# Patient Record
Sex: Male | Born: 1955 | Hispanic: No | Marital: Married | Smoking: Never smoker
Health system: Southern US, Community
[De-identification: ages and names within clinical notes are randomized; demographics above are authoritative.]

## PROBLEM LIST (undated history)

## (undated) DIAGNOSIS — F419 Anxiety disorder, unspecified: Secondary | ICD-10-CM

## (undated) DIAGNOSIS — I4510 Unspecified right bundle-branch block: Secondary | ICD-10-CM

## (undated) DIAGNOSIS — R55 Syncope and collapse: Secondary | ICD-10-CM

## (undated) DIAGNOSIS — Z95820 Peripheral vascular angioplasty status with implants and grafts: Secondary | ICD-10-CM

## (undated) DIAGNOSIS — E785 Hyperlipidemia, unspecified: Secondary | ICD-10-CM

## (undated) DIAGNOSIS — E786 Lipoprotein deficiency: Secondary | ICD-10-CM

## (undated) DIAGNOSIS — E663 Overweight: Secondary | ICD-10-CM

## (undated) DIAGNOSIS — E7800 Pure hypercholesterolemia, unspecified: Secondary | ICD-10-CM

## (undated) DIAGNOSIS — I2510 Atherosclerotic heart disease of native coronary artery without angina pectoris: Secondary | ICD-10-CM

## (undated) DIAGNOSIS — E119 Type 2 diabetes mellitus without complications: Secondary | ICD-10-CM

## (undated) DIAGNOSIS — R42 Dizziness and giddiness: Secondary | ICD-10-CM

## (undated) DIAGNOSIS — I10 Essential (primary) hypertension: Secondary | ICD-10-CM

## (undated) DIAGNOSIS — R0789 Other chest pain: Secondary | ICD-10-CM

## (undated) DIAGNOSIS — M549 Dorsalgia, unspecified: Secondary | ICD-10-CM

## (undated) HISTORY — PX: KNEE ARTHROSCOPY: SHX127

## (undated) HISTORY — PX: SURGERY SCROTAL / TESTICULAR: SUR1316

## (undated) HISTORY — DX: Dorsalgia, unspecified: M54.9

## (undated) HISTORY — DX: Lipoprotein deficiency: E78.6

## (undated) HISTORY — PX: SHOULDER SURGERY: SHX246

## (undated) HISTORY — DX: Hyperlipidemia, unspecified: E78.5

## (undated) HISTORY — DX: Dizziness and giddiness: R42

## (undated) HISTORY — DX: Syncope and collapse: R55

## (undated) HISTORY — DX: Pure hypercholesterolemia, unspecified: E78.00

## (undated) HISTORY — DX: Overweight: E66.3

## (undated) HISTORY — DX: Unspecified right bundle-branch block: I45.10

## (undated) HISTORY — DX: Type 2 diabetes mellitus without complications: E11.9

## (undated) HISTORY — DX: Anxiety disorder, unspecified: F41.9

## (undated) HISTORY — DX: Essential (primary) hypertension: I10

---
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
## 1998-07-29 ENCOUNTER — Emergency Department (HOSPITAL_COMMUNITY): Admission: EM | Admit: 1998-07-29 | Discharge: 1998-07-30 | Payer: Self-pay | Admitting: Emergency Medicine

## 1998-07-29 ENCOUNTER — Encounter: Payer: Self-pay | Admitting: Cardiovascular Disease

## 1998-07-30 ENCOUNTER — Encounter: Payer: Self-pay | Admitting: Cardiovascular Disease

## 2006-02-18 ENCOUNTER — Ambulatory Visit (HOSPITAL_BASED_OUTPATIENT_CLINIC_OR_DEPARTMENT_OTHER): Admission: RE | Admit: 2006-02-18 | Discharge: 2006-02-18 | Payer: Self-pay | Admitting: Orthopedic Surgery

## 2008-12-27 ENCOUNTER — Ambulatory Visit (HOSPITAL_BASED_OUTPATIENT_CLINIC_OR_DEPARTMENT_OTHER): Admission: RE | Admit: 2008-12-27 | Discharge: 2008-12-27 | Payer: Self-pay | Admitting: Specialist

## 2009-06-12 ENCOUNTER — Encounter: Admission: RE | Admit: 2009-06-12 | Discharge: 2009-06-12 | Payer: Self-pay | Admitting: Family Medicine

## 2010-09-20 LAB — POCT I-STAT 4, (NA,K, GLUC, HGB,HCT)
Glucose, Bld: 111 mg/dL — ABNORMAL HIGH (ref 70–99)
HCT: 43 % (ref 39.0–52.0)
Hemoglobin: 14.6 g/dL (ref 13.0–17.0)
Potassium: 3.8 mEq/L (ref 3.5–5.1)
Sodium: 141 mEq/L (ref 135–145)
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
## 2010-10-30 NOTE — Op Note (Signed)
Jason Suarez, Jason Suarez NO.:  [id number]   MEDICAL RECORD NO.:  [id number]          PATIENT TYPE:  AMB   LOCATION:  [facility]                         FACILITY:  [facility]   PHYSICIAN:  Marlowe Kays, M.D.  DATE OF BIRTH:  04/06/1956   DATE OF PROCEDURE:  02/18/2006  DATE OF DISCHARGE:                                 OPERATIVE REPORT   PREOPERATIVE DIAGNOSIS:  Suspected torn lateral meniscus, right knee.   POSTOPERATIVE DIAGNOSES:  1. Torn medial and lateral menisci.  2. Full-thickness cartilaginous defect, lateral tibial plateau   OPERATION:  1. Right knee arthroscopy with partial medial meniscectomy.  2. Debridement of the cartilaginous defect of the articular cartilaginous      defect, lateral tibial plateau.   SURGEON:  Marlowe Kays, M.D.   ASSISTANT:  Nurse.   ANESTHESIA:  General.   INDICATIONS FOR PROCEDURE:  He was having no problems with his knee until  early July after working out on a treadmill. He has had persistent pain  mainly over the lateral joint.  An MRI of 08/08 has demonstrated some  irregularity of the lateral tibial plateau,  what was felt the patient be a  subtle tear of the lateral meniscus.  See the operative  description below  for the additional details of the pathology.  Because of failure to improve  with rest and bracing and time, he is here today for the above-mentioned  surgery.   PROCEDURE:  Satisfactory general anesthesia, pneumatic tourniquet right leg  was Esmarched out non-sterilely and inflated to 250 mmHg.  The thigh  stabilizer applied.  The left leg was wrapped with an Ace wrap and a  posterior knee support, with superior medial saline inflow.  I first entered  the medial portal, lateral compartment and  the  knee joint was evaluated.  He had some fraying of the anterior third of the lateral meniscus.  The  major pathology was a full-thickness defect with a flap of articular  cartilage in the mid-medial portion of the  lateral tibial plateau.  There  was also some associated fraying of the ACL and posterior lateral meniscus.  All of these areas were debrided out with the 3.5 shaver with final pictures  being taken.  Looking at the lateral gutter and sub-patellar area, no  operable problems were noted.  There was some very minimal wear of the  patella.  I then reversed portals.  The medial meniscus was completely  normal except for the anterior third.  I first noted a large amount of  synovitis there which I felt might be a mechanical problem.  I began  resecting it, finding that it was attached to an obviously-damaged anterior  horn of the medial meniscus which I first shaved with a 3.5 shaver and then  trimmed down with a small arthroscopic scissors.  Final pictures were taken.  The joint was then irrigated until clear and all fluid possible removed. The  two anterior  portals were closed with #4-0 nylon.  I injected 20 mL of half  percent Marcaine with adrenalin and 4 mg of morphine through the  inflow  apparatus which was removed and this portal closed with #4-0 nylon as well.  Betadine and adaptic dressing were applied.  The tourniquet was released.   He tolerated the procedure well was taken to the recovery room in  satisfactory condition,  with no known complications.           ______________________________  Marlowe Kays, M.D.     JA/MEDQ  D:  02/18/2006  T:  02/18/2006  Job:  [id number]

## 2011-04-27 DIAGNOSIS — E538 Deficiency of other specified B group vitamins: Secondary | ICD-10-CM | POA: Insufficient documentation

## 2011-11-07 ENCOUNTER — Encounter: Payer: Self-pay | Admitting: *Deleted

## 2012-03-03 ENCOUNTER — Encounter: Payer: Self-pay | Admitting: Cardiovascular Disease

## 2012-03-03 ENCOUNTER — Ambulatory Visit (INDEPENDENT_AMBULATORY_CARE_PROVIDER_SITE_OTHER): Payer: BC Managed Care – PPO | Admitting: Cardiovascular Disease

## 2012-03-03 ENCOUNTER — Other Ambulatory Visit: Payer: Self-pay | Admitting: *Deleted

## 2012-03-03 VITALS — BP 135/87 | HR 56 | Ht 65.0 in | Wt 156.1 lb

## 2012-03-03 DIAGNOSIS — R0789 Other chest pain: Secondary | ICD-10-CM

## 2012-03-03 DIAGNOSIS — I4510 Unspecified right bundle-branch block: Secondary | ICD-10-CM

## 2012-03-03 HISTORY — DX: Other chest pain: R07.89

## 2012-03-03 HISTORY — DX: Unspecified right bundle-branch block: I45.10

## 2012-03-03 NOTE — Assessment & Plan Note (Signed)
Is ready presents today with recurrent episodes of chest pressure. These episodes have been present for the past several months and typically last for a few minutes. They're not necessarily associated with any specific activities.  He has a) to block at baseline.  We will refer him for a stress echocardiogram for further evaluation. I'll see him on an as-needed basis following a stress echo.

## 2012-03-03 NOTE — Progress Notes (Signed)
    Ashkan Betti Cruz Date of Birth  Nov 26, 1955       [facility] Office [address]  [address] [place], [place]  [address]   [place], [place]  [address] [phone]     [phone]   Fax  [phone]    Fax [phone]  Problem List: 1. Chest pressure  History of Present Illness:  Mr. Langlois presents today with episodes of recurrent chest pressure/ heaviness. I've seen him in the past for episodes of chest pressure. These episodes started several months ago. They typically last a few minutes at a time. They're not associated with exercise, eating, drinking, change of position or taking a deep breath.  We performed a stress myoivew several years ago which was normal.    Current Outpatient Prescriptions on File Prior to Visit  Medication Sig Dispense Refill  . ANDROGEL PUMP 20.25 MG/ACT (1.62%) GEL daily.       Marland Kitchen aspirin 81 MG tablet Take 81 mg by mouth daily.      . fish oil-omega-3 fatty acids 1000 MG capsule Take 2 g by mouth daily.      Marland Kitchen losartan (COZAAR) 50 MG tablet Take 100 mg by mouth daily.       . Multiple Vitamins-Minerals (MULTIVITAMINS THER. W/MINERALS) TABS Take 1 tablet by mouth daily.        Allergies  Allergen Reactions  . Lisinopril     Lip Inflammation     Past Medical History  Diagnosis Date  . Hypertension   . Chest pain   . Syncopal episodes   . Dizziness   . Light headedness   . RBBB (right bundle branch block)     Past Surgical History  Procedure Date  . Shoulder surgery     right   . Knee arthroscopy     left    History  Smoking status  . Never Smoker   Smokeless tobacco  . Never Used    History  Alcohol Use: Not on file    Family History  Problem Relation Age of Onset  . Heart attack Father     had stent   . Coronary artery disease Father   . Hypertension Father     Reviw of Systems:  Reviewed in the HPI.  All other systems are negative.  Physical Exam: Blood  pressure 135/87, pulse 56, height 5\' 5"  (1.651 m), weight 156 lb 1.9 oz (70.816 kg). General: Well developed, well nourished, in no acute distress.  Head: Normocephalic, atraumatic, sclera non-icteric, mucus membranes are moist,   Neck: Supple. Carotids are 2 + without bruits. No JVD  Lungs: Clear bilaterally to auscultation.  Heart: regular rate.  normal  S1 S2. No murmurs, gallops or rubs.  Abdomen: Soft, non-tender, non-distended with normal bowel sounds. No hepatomegaly. No rebound/guarding. No masses.  Msk:  Strength and tone are normal  Extremities: No clubbing or cyanosis. No edema.  Distal pedal pulses are 2+ and equal bilaterally.  Neuro: Alert and oriented X 3. Moves all extremities spontaneously.  Psych:  Responds to questions appropriately with a normal affect.  ECG: Sept. 20, 2013 - sinus brady at 90.  RBBB.   Assessment / Plan:

## 2012-03-03 NOTE — Patient Instructions (Signed)
Your physician has requested that you have a stress echocardiogram. Please follow instruction sheet as given.  Your physician recommends that you schedule a follow-up appointment in: AS NEEDED,  DEPENDING ON RESULTS

## 2012-03-16 ENCOUNTER — Ambulatory Visit (HOSPITAL_COMMUNITY): Payer: BC Managed Care – PPO | Attending: Cardiology

## 2012-03-16 ENCOUNTER — Other Ambulatory Visit (HOSPITAL_COMMUNITY): Payer: Self-pay | Admitting: Cardiovascular Disease

## 2012-03-16 DIAGNOSIS — R072 Precordial pain: Secondary | ICD-10-CM

## 2012-03-16 DIAGNOSIS — I10 Essential (primary) hypertension: Secondary | ICD-10-CM | POA: Insufficient documentation

## 2012-03-16 DIAGNOSIS — R0789 Other chest pain: Secondary | ICD-10-CM

## 2012-03-16 DIAGNOSIS — I4510 Unspecified right bundle-branch block: Secondary | ICD-10-CM | POA: Insufficient documentation

## 2012-03-16 DIAGNOSIS — R0989 Other specified symptoms and signs involving the circulatory and respiratory systems: Secondary | ICD-10-CM

## 2012-03-16 NOTE — Progress Notes (Signed)
Echocardiogram performed.  

## 2012-08-03 ENCOUNTER — Encounter: Payer: Self-pay | Admitting: Cardiovascular Disease

## 2012-08-15 ENCOUNTER — Encounter: Payer: Self-pay | Admitting: Cardiovascular Disease

## 2014-02-27 ENCOUNTER — Ambulatory Visit (INDEPENDENT_AMBULATORY_CARE_PROVIDER_SITE_OTHER): Payer: BC Managed Care – PPO

## 2014-02-27 ENCOUNTER — Encounter: Payer: Self-pay | Admitting: Podiatry

## 2014-02-27 ENCOUNTER — Ambulatory Visit (INDEPENDENT_AMBULATORY_CARE_PROVIDER_SITE_OTHER): Payer: BC Managed Care – PPO | Admitting: Podiatry

## 2014-02-27 VITALS — BP 113/71 | HR 55 | Resp 18

## 2014-02-27 DIAGNOSIS — R52 Pain, unspecified: Secondary | ICD-10-CM

## 2014-02-27 DIAGNOSIS — M722 Plantar fascial fibromatosis: Secondary | ICD-10-CM

## 2014-02-27 MED ORDER — TRIAMCINOLONE ACETONIDE 10 MG/ML IJ SUSP
10.0000 mg | Freq: Once | INTRAMUSCULAR | Status: AC
  Administered 2014-02-27: 10 mg

## 2014-02-27 NOTE — Patient Instructions (Signed)
Plantar Fasciitis (Heel Spur Syndrome) with Rehab The plantar fascia is a fibrous, ligament-like, soft-tissue structure that spans the bottom of the foot. Plantar fasciitis is a condition that causes pain in the foot due to inflammation of the tissue. SYMPTOMS   Pain and tenderness on the underneath side of the foot.  Pain that worsens with standing or walking. CAUSES  Plantar fasciitis is caused by irritation and injury to the plantar fascia on the underneath side of the foot. Common mechanisms of injury include:  Direct trauma to bottom of the foot.  Damage to a small nerve that runs under the foot where the main fascia attaches to the heel bone.  Stress placed on the plantar fascia due to bone spurs. RISK INCREASES WITH:   Activities that place stress on the plantar fascia (running, jumping, pivoting, or cutting).  Poor strength and flexibility.  Improperly fitted shoes.  Tight calf muscles.  Flat feet.  Failure to warm-up properly before activity.  Obesity. PREVENTION  Warm up and stretch properly before activity.  Allow for adequate recovery between workouts.  Maintain physical fitness:  Strength, flexibility, and endurance.  Cardiovascular fitness.  Maintain a health body weight.  Avoid stress on the plantar fascia.  Wear properly fitted shoes, including arch supports for individuals who have flat feet. PROGNOSIS  If treated properly, then the symptoms of plantar fasciitis usually resolve without surgery. However, occasionally surgery is necessary. RELATED COMPLICATIONS   Recurrent symptoms that may result in a chronic condition.  Problems of the lower back that are caused by compensating for the injury, such as limping.  Pain or weakness of the foot during push-off following surgery.  Chronic inflammation, scarring, and partial or complete fascia tear, occurring more often from repeated injections. TREATMENT  Treatment initially involves the use of  ice and medication to help reduce pain and inflammation. The use of strengthening and stretching exercises may help reduce pain with activity, especially stretches of the Achilles tendon. These exercises may be performed at home or with a therapist. Your caregiver may recommend that you use heel cups of arch supports to help reduce stress on the plantar fascia. Occasionally, corticosteroid injections are given to reduce inflammation. If symptoms persist for greater than 6 months despite non-surgical (conservative), then surgery may be recommended.  MEDICATION   If pain medication is necessary, then nonsteroidal anti-inflammatory medications, such as aspirin and ibuprofen, or other minor pain relievers, such as acetaminophen, are often recommended.  Do not take pain medication within 7 days before surgery.  Prescription pain relievers may be given if deemed necessary by your caregiver. Use only as directed and only as much as you need.  Corticosteroid injections may be given by your caregiver. These injections should be reserved for the most serious cases, because they may only be given a certain number of times. HEAT AND COLD  Cold treatment (icing) relieves pain and reduces inflammation. Cold treatment should be applied for 10 to 15 minutes every 2 to 3 hours for inflammation and pain and immediately after any activity that aggravates your symptoms. Use ice packs or massage the area with a piece of ice (ice massage).  Heat treatment may be used prior to performing the stretching and strengthening activities prescribed by your caregiver, physical therapist, or athletic trainer. Use a heat pack or soak the injury in warm water. SEEK IMMEDIATE MEDICAL CARE IF:  Treatment seems to offer no benefit, or the condition worsens.  Any medications produce adverse side effects. EXERCISES RANGE   OF MOTION (ROM) AND STRETCHING EXERCISES - Plantar Fasciitis (Heel Spur Syndrome) These exercises may help you  when beginning to rehabilitate your injury. Your symptoms may resolve with or without further involvement from your physician, physical therapist or athletic trainer. While completing these exercises, remember:   Restoring tissue flexibility helps normal motion to return to the joints. This allows healthier, less painful movement and activity.  An effective stretch should be held for at least 30 seconds.  A stretch should never be painful. You should only feel a gentle lengthening or release in the stretched tissue. RANGE OF MOTION - Toe Extension, Flexion  Sit with your right / left leg crossed over your opposite knee.  Grasp your toes and gently pull them back toward the top of your foot. You should feel a stretch on the bottom of your toes and/or foot.  Hold this stretch for __________ seconds.  Now, gently pull your toes toward the bottom of your foot. You should feel a stretch on the top of your toes and or foot.  Hold this stretch for __________ seconds. Repeat __________ times. Complete this stretch __________ times per day.  RANGE OF MOTION - Ankle Dorsiflexion, Active Assisted  Remove shoes and sit on a chair that is preferably not on a carpeted surface.  Place right / left foot under knee. Extend your opposite leg for support.  Keeping your heel down, slide your right / left foot back toward the chair until you feel a stretch at your ankle or calf. If you do not feel a stretch, slide your bottom forward to the edge of the chair, while still keeping your heel down.  Hold this stretch for __________ seconds. Repeat __________ times. Complete this stretch __________ times per day.  STRETCH - Gastroc, Standing  Place hands on wall.  Extend right / left leg, keeping the front knee somewhat bent.  Slightly point your toes inward on your back foot.  Keeping your right / left heel on the floor and your knee straight, shift your weight toward the wall, not allowing your back to  arch.  You should feel a gentle stretch in the right / left calf. Hold this position for __________ seconds. Repeat __________ times. Complete this stretch __________ times per day. STRETCH - Soleus, Standing  Place hands on wall.  Extend right / left leg, keeping the other knee somewhat bent.  Slightly point your toes inward on your back foot.  Keep your right / left heel on the floor, bend your back knee, and slightly shift your weight over the back leg so that you feel a gentle stretch deep in your back calf.  Hold this position for __________ seconds. Repeat __________ times. Complete this stretch __________ times per day. STRETCH - Gastrocsoleus, Standing  Note: This exercise can place a lot of stress on your foot and ankle. Please complete this exercise only if specifically instructed by your caregiver.   Place the ball of your right / left foot on a step, keeping your other foot firmly on the same step.  Hold on to the wall or a rail for balance.  Slowly lift your other foot, allowing your body weight to press your heel down over the edge of the step.  You should feel a stretch in your right / left calf.  Hold this position for __________ seconds.  Repeat this exercise with a slight bend in your right / left knee. Repeat __________ times. Complete this stretch __________ times per day.    STRENGTHENING EXERCISES - Plantar Fasciitis (Heel Spur Syndrome)  These exercises may help you when beginning to rehabilitate your injury. They may resolve your symptoms with or without further involvement from your physician, physical therapist or athletic trainer. While completing these exercises, remember:   Muscles can gain both the endurance and the strength needed for everyday activities through controlled exercises.  Complete these exercises as instructed by your physician, physical therapist or athletic trainer. Progress the resistance and repetitions only as guided. STRENGTH -  Towel Curls  Sit in a chair positioned on a non-carpeted surface.  Place your foot on a towel, keeping your heel on the floor.  Pull the towel toward your heel by only curling your toes. Keep your heel on the floor.  If instructed by your physician, physical therapist or athletic trainer, add ____________________ at the end of the towel. Repeat __________ times. Complete this exercise __________ times per day. STRENGTH - Ankle Inversion  Secure one end of a rubber exercise band/tubing to a fixed object (table, pole). Loop the other end around your foot just before your toes.  Place your fists between your knees. This will focus your strengthening at your ankle.  Slowly, pull your big toe up and in, making sure the band/tubing is positioned to resist the entire motion.  Hold this position for __________ seconds.  Have your muscles resist the band/tubing as it slowly pulls your foot back to the starting position. Repeat __________ times. Complete this exercises __________ times per day.  Document Released: 05/31/2005 Document Revised: 08/23/2011 Document Reviewed: 09/12/2008 [employer] Patient Information 2015 [employer]. This information is not intended to replace advice given to you by your health care provider. Make sure you discuss any questions you have with your health care provider.  

## 2014-02-27 NOTE — Progress Notes (Signed)
   Subjective:    Patient ID: Jason Suarez, male    DOB: 17-Jul-1955, 58 y.o.   MRN: [id number]  HPI Jason Suarez presents the office today with complaints of bilateral heel pain. He states the pain has been ongoing for approximately 4 months and states the pain is along the plantar central aspect of his heels bilaterally and some pain into the arch of the foot. He states it is tender in the morning and after periods of activity. States that his left is more severe than the right. Denies any trauma or injury to the area.  He has had no prior treatment.    Review of Systems  Genitourinary: Positive for frequency.  All other systems reviewed and are negative.      Objective:   Physical Exam AAO x3, NAD DP/PT pulses palpable 2/4 b/l. CRT < 3 sec Protective sensation intact the Semmes Weinstein monofilament, vibratory sensation intact, Achilles tendon reflex intact. Negative tinels sign  Tenderness to palpation over the plantar medial aspect of the calcaneus overlying the insertion of the plantar fascia with the left more severe than the right. No pain with lateral compression of the calcaneus or along the posterior aspect of the calcaneus bilaterally. Plantar fascia appears to be intact. Mild equinus. No pinpoint bony tenderness or pain with vibratory sensation. MMT 5/5, ROM WNL No open skin lesions No leg pain, swelling, warmth.       Assessment & Plan:  58 year old male with bilateral plantar fasciitis with left greater than right. -X-rays were obtained and reviewed with the patient. -Conservative versus surgical treatment were discussed including alternatives, risks, complications. -Patient elects to proceed with steroid injection into the left heel. Under sterile skin preparation, a total of 2.5cc of kenalog 10, 0.5% Marcaine plain, and 2% lidocaine plain were infiltrated into the symptomatic area without complication. A band-aid was applied. Patient tolerated the injection well  without complication.  -Dispensed plantar fascial brace. -Ice to the effected area. -Stretching exercises discussed. -Discussed the importance of proper shoe gear and possible orthotics. -Followup in 3 weeks or sooner if any problems are to arise or any changes symptoms. In the meantime call any questions or concerns.

## 2014-03-20 ENCOUNTER — Ambulatory Visit: Payer: BC Managed Care – PPO | Admitting: Podiatry

## 2014-03-21 ENCOUNTER — Other Ambulatory Visit: Payer: Self-pay | Admitting: Family Medicine

## 2014-03-21 DIAGNOSIS — R1032 Left lower quadrant pain: Secondary | ICD-10-CM

## 2014-03-21 DIAGNOSIS — R109 Unspecified abdominal pain: Secondary | ICD-10-CM

## 2014-03-28 ENCOUNTER — Ambulatory Visit
Admission: RE | Admit: 2014-03-28 | Discharge: 2014-03-28 | Disposition: A | Discharge status: Home / Self Care | Payer: BC Managed Care – PPO | Source: Ambulatory Visit | Attending: Family Medicine | Admitting: Family Medicine

## 2014-03-28 DIAGNOSIS — R109 Unspecified abdominal pain: Secondary | ICD-10-CM

## 2014-03-28 MED ORDER — IOHEXOL 300 MG/ML  SOLN: 100.0000 mL | Freq: Once | INTRAMUSCULAR | Status: AC | PRN

## 2014-03-28 MED ORDER — IOHEXOL 300 MG/ML  SOLN
100.0000 mL | Freq: Once | INTRAMUSCULAR | Status: AC | PRN
  Administered 2014-03-28: 100 mL via INTRAVENOUS

## 2014-04-02 ENCOUNTER — Other Ambulatory Visit: Payer: Self-pay | Admitting: Family Medicine

## 2014-04-02 DIAGNOSIS — R1032 Left lower quadrant pain: Secondary | ICD-10-CM

## 2014-04-05 ENCOUNTER — Ambulatory Visit
Admission: RE | Admit: 2014-04-05 | Discharge: 2014-04-05 | Disposition: A | Discharge status: Home / Self Care | Payer: BC Managed Care – PPO | Source: Ambulatory Visit | Attending: Family Medicine | Admitting: Family Medicine

## 2014-04-05 DIAGNOSIS — R1032 Left lower quadrant pain: Secondary | ICD-10-CM

## 2014-04-05 MED ORDER — GADOBENATE DIMEGLUMINE 529 MG/ML IV SOLN
14.0000 mL | Freq: Once | INTRAVENOUS | Status: AC | PRN
  Administered 2014-04-05: 14 mL via INTRAVENOUS

## 2014-11-28 DIAGNOSIS — Q540 Hypospadias, balanic: Secondary | ICD-10-CM | POA: Insufficient documentation

## 2017-03-10 DIAGNOSIS — H2513 Age-related nuclear cataract, bilateral: Secondary | ICD-10-CM | POA: Diagnosis not present

## 2017-04-05 DIAGNOSIS — Z23 Encounter for immunization: Secondary | ICD-10-CM | POA: Diagnosis not present

## 2017-05-04 DIAGNOSIS — E785 Hyperlipidemia, unspecified: Secondary | ICD-10-CM | POA: Diagnosis not present

## 2017-05-04 DIAGNOSIS — I10 Essential (primary) hypertension: Secondary | ICD-10-CM | POA: Diagnosis not present

## 2017-05-04 DIAGNOSIS — E119 Type 2 diabetes mellitus without complications: Secondary | ICD-10-CM | POA: Diagnosis not present

## 2017-05-04 DIAGNOSIS — Z125 Encounter for screening for malignant neoplasm of prostate: Secondary | ICD-10-CM | POA: Diagnosis not present

## 2017-05-04 DIAGNOSIS — Z0000 Encounter for general adult medical examination without abnormal findings: Secondary | ICD-10-CM | POA: Diagnosis not present

## 2017-07-11 DIAGNOSIS — M19042 Primary osteoarthritis, left hand: Secondary | ICD-10-CM | POA: Diagnosis not present

## 2017-07-11 DIAGNOSIS — M19049 Primary osteoarthritis, unspecified hand: Secondary | ICD-10-CM | POA: Insufficient documentation

## 2018-03-17 DIAGNOSIS — Z23 Encounter for immunization: Secondary | ICD-10-CM | POA: Diagnosis not present

## 2018-05-05 DIAGNOSIS — M47896 Other spondylosis, lumbar region: Secondary | ICD-10-CM | POA: Diagnosis not present

## 2018-05-05 DIAGNOSIS — M5416 Radiculopathy, lumbar region: Secondary | ICD-10-CM | POA: Diagnosis not present

## 2018-05-05 DIAGNOSIS — M5136 Other intervertebral disc degeneration, lumbar region: Secondary | ICD-10-CM | POA: Diagnosis not present

## 2018-05-17 DIAGNOSIS — E7800 Pure hypercholesterolemia, unspecified: Secondary | ICD-10-CM | POA: Diagnosis not present

## 2018-05-17 DIAGNOSIS — I10 Essential (primary) hypertension: Secondary | ICD-10-CM | POA: Diagnosis not present

## 2018-05-17 DIAGNOSIS — E119 Type 2 diabetes mellitus without complications: Secondary | ICD-10-CM | POA: Diagnosis not present

## 2018-05-17 DIAGNOSIS — Z125 Encounter for screening for malignant neoplasm of prostate: Secondary | ICD-10-CM | POA: Diagnosis not present

## 2018-05-27 DIAGNOSIS — M5136 Other intervertebral disc degeneration, lumbar region: Secondary | ICD-10-CM | POA: Insufficient documentation

## 2018-10-17 ENCOUNTER — Telehealth: Payer: Self-pay | Admitting: Nurse Practitioner

## 2018-10-17 NOTE — Telephone Encounter (Signed)
Left message for patient to call back regarding appointment on 5/14 with Dr. Elease Hashimoto

## 2018-10-17 NOTE — Telephone Encounter (Signed)
Spoke with patient regarding his upcoming appointment on 5/14 with Dr. Elease Hashimoto. I asked about his symptoms and he states he sometimes has chest pain. I attempted to get more information and the patient states he would like to reschedule the appointment. I advised that we can schedule a virtual visit with Dr. Elease Hashimoto for better assessment of his symptoms and patient states he is doing okay and would prefer to reschedule. I moved his appointment to August and advised him to call back if symptoms worsen so that we can assess his problem. He verbalized understanding and agreement.

## 2018-10-17 NOTE — Telephone Encounter (Signed)
error 

## 2018-10-26 ENCOUNTER — Ambulatory Visit: Payer: Self-pay | Admitting: Cardiovascular Disease

## 2019-01-31 ENCOUNTER — Other Ambulatory Visit: Payer: Self-pay

## 2019-01-31 ENCOUNTER — Ambulatory Visit: Payer: BC Managed Care – PPO | Admitting: Cardiovascular Disease

## 2019-01-31 ENCOUNTER — Encounter (INDEPENDENT_AMBULATORY_CARE_PROVIDER_SITE_OTHER): Payer: Self-pay

## 2019-01-31 ENCOUNTER — Encounter: Payer: Self-pay | Admitting: Cardiovascular Disease

## 2019-01-31 VITALS — BP 142/78 | HR 51 | Ht 65.0 in | Wt 151.1 lb

## 2019-01-31 DIAGNOSIS — I4510 Unspecified right bundle-branch block: Secondary | ICD-10-CM

## 2019-01-31 DIAGNOSIS — R072 Precordial pain: Secondary | ICD-10-CM

## 2019-01-31 DIAGNOSIS — R0789 Other chest pain: Secondary | ICD-10-CM | POA: Diagnosis not present

## 2019-01-31 MED ORDER — NITROGLYCERIN 0.4 MG SL SUBL: 0.4000 mg | SUBLINGUAL_TABLET | SUBLINGUAL | 3 refills | Status: DC | PRN

## 2019-01-31 NOTE — H&P (View-Only) (Signed)
Cardiology Office Note:    Date:  01/31/2019   ID:  Jason Suarez, DOB 08-Jun-1956, MRN [id number]  PCP:  Jason Nip, MD  Cardiologist:  Jason Suarez  Electrophysiologist:  None   Referring MD: Jason Nip, MD   Chief Complaint  Patient presents with  . Hypertension  . Hyperlipidemia  . Chest Pain    Aug. 19, 2020    Jason Suarez is a 63 y.o. male with a hx of HTN, hyperlipidemia, chest pain I last saw him in 2013. Has developed cp with push mowing or doing other exercise.  Pain resolves after several minutes.    CP is a tightness Chest tightness is associated with diaphoresis,  Light headedness.  No syncope  He stops exercising and the pain resolves.  This pain is different from previous CP  Occurs every time he exercises.  Progressive over the past 6 months .   Pain is not related to twisting / turning.   Not related to eating, or taking a dee breath  Tries to avoid salty foods.  Recent cholesterol panel looks good.  His total cholesterol is 99.  The HDL is 37.  The LDL is 53.  The triglyceride level is 73.  + family hx of CAD  Brother had MI at age 29.   Father had MI at age 21   Past Medical History:  Diagnosis Date  . Anxiety   . Back pain   . Chest pain   . Chest pressure 03/03/2012  . Dizziness   . DM (diabetes mellitus) ([place])   . Hypercholesterolemia   . Hypertension   . Light headedness   . Low HDL (under 40)   . Overweight   . RBBB 03/03/2012  . Syncopal episodes     Past Surgical History:  Procedure Laterality Date  . KNEE ARTHROSCOPY     left  . SHOULDER SURGERY     right   . SURGERY SCROTAL / TESTICULAR      Current Medications: Current Meds  Medication Sig  . aspirin 81 MG tablet Take 81 mg by mouth daily.  Marland Kitchen atorvastatin (LIPITOR) 20 MG tablet Take 20 mg by mouth daily.  . fish oil-omega-3 fatty acids 1000 MG capsule Take 2 g by mouth daily.  Marland Kitchen losartan (COZAAR) 50 MG tablet Take 1 tablet by mouth daily.  . Multiple  Vitamins-Minerals (MULTIVITAMINS THER. W/MINERALS) TABS Take 1 tablet by mouth daily.     Allergies:   Lisinopril   Social History   Socioeconomic History  . Marital status: Married    Spouse name: Not on file  . Number of children: Not on file  . Years of education: Not on file  . Highest education level: Not on file  Occupational History  . Not on file  Social Needs  . Financial resource strain: Not on file  . Food insecurity    Worry: Not on file    Inability: Not on file  . Transportation needs    Medical: Not on file    Non-medical: Not on file  Tobacco Use  . Smoking status: Never Smoker  . Smokeless tobacco: Never Used  Substance and Sexual Activity  . Alcohol use: Yes  . Drug use: Never  . Sexual activity: Yes    Comment: MARRIED  Lifestyle  . Physical activity    Days per week: Not on file    Minutes per session: Not on file  . Stress: Not on file  Relationships  . Social  connections    Talks on phone: Not on file    Gets together: Not on file    Attends religious service: Not on file    Active member of club or organization: Not on file    Attends meetings of clubs or organizations: Not on file    Relationship status: Not on file  Other Topics Concern  . Not on file  Social History Narrative  . Not on file     Family History: The patient's family history includes Coronary artery disease in his father; Diabetes in his mother; Heart attack in his father; Heart disease in his brother; Hypertension in his father and mother.  ROS:   Please see the history of present illness.     All other systems reviewed and are negative.  EKGs/Labs/Other Studies Reviewed:    The following studies were reviewed today:   EKG:  EKG is  ordered today.  The ekg ordered today demonstrates :  Sinus brady at 51 ,  RBBB  Recent Labs: No results found for requested labs within last 8760 hours.  Recent Lipid Panel No results found for: CHOL, TRIG, HDL, CHOLHDL, VLDL,  LDLCALC, LDLDIRECT  Physical Exam:    VS:  BP (!) 142/78   Pulse (!) 51   Ht 5' 5" (1.651 m)   Wt 151 lb 1.9 oz (68.5 kg)   SpO2 98%   BMI 25.15 kg/m     Wt Readings from Last 3 Encounters:  01/31/19 151 lb 1.9 oz (68.5 kg)  03/03/12 156 lb 1.9 oz (70.8 kg)  10/06/09 160 lb (72.6 kg)     GEN:  Well nourished, well developed in no acute distress HEENT: Normal NECK: No JVD; No carotid bruits LYMPHATICS: No lymphadenopathy CARDIAC: RRR, no murmurs, rubs, gallops RESPIRATORY:  Clear to auscultation without rales, wheezing or rhonchi  ABDOMEN: Soft, non-tender, non-distended MUSCULOSKELETAL:  No edema; No deformity  SKIN: Warm and dry NEUROLOGIC:  Alert and oriented x 3 PSYCHIATRIC:  Normal affect   ASSESSMENT:    1. Precordial pain    PLAN:    In order of problems listed above:  1. Chest tightness:  Jason Suarez has developed chest pressure with exertion.   He has had several types of CP in the past and has had negative stress testing .   His syptoms sound c/w angina .   I would like to get a coronary CT angiogram for further eval.  2.  Hyperlipidemia :   Cont meds.  Last levels look good    Medication Adjustments/Labs and Tests Ordered: Current medicines are reviewed at length with the patient today.  Concerns regarding medicines are outlined above.  Orders Placed This Encounter  Procedures  . CT CORONARY MORPH W/CTA COR W/SCORE W/CA W/CM &/OR WO/CM  . CT CORONARY FRACTIONAL FLOW RESERVE DATA PREP  . CT CORONARY FRACTIONAL FLOW RESERVE FLUID ANALYSIS  . Basic metabolic panel  . EKG 12-Lead   Meds ordered this encounter  Medications  . nitroGLYCERIN (NITROSTAT) 0.4 MG SL tablet    Sig: Place 1 tablet (0.4 mg total) under the tongue every 5 (five) minutes as needed for chest pain.    Dispense:  25 tablet    Refill:  3    Patient Instructions  Medication Instructions:  Your physician has recommended you make the following change in your medication:   A  prescription for Sublingual Nitroglycerin has been called in to your pharmacy. Use as directed  If you need a refill   on your cardiac medications before your next appointment, please call your pharmacy.   Lab work: TODAY: BMET  If you have labs (blood work) drawn today and your tests are completely normal, you will receive your results only by: Marland Kitchen. MyChart Message (if you have MyChart) OR . A paper copy in the mail If you have any lab test that is abnormal or we need to change your treatment, we will call you to review the results.  Testing/Procedures: Your physician has requested that you have cardiac CT. Cardiac computed tomography (CT) is a painless test that uses an x-ray machine to take clear, detailed pictures of your heart. For further information please visit [email or web address]. Please follow instruction sheet as given.  Follow-Up: . Follow up with Dr. Elease HashimotoNahser on 05/08/19 at 2:00 PM  Any Other Special Instructions Will Be Listed Below (If Applicable).  CARDIAC CT INSTRUCTIONS  Your cardiac CT will be scheduled at one of the below locations:   [facility] [address] [place], [place] [address] [phone]  OR  [facility] [address] [place], [place] [address] [phone]  Please arrive at the [facility] main entrance of [facility] 30-45 minutes prior to test start time. Proceed to the [facility] Radiology Department (first floor) to check-in and test prep.  Please follow these instructions carefully (unless otherwise directed):   On the Night Before the Test: . Be sure to Drink plenty of water. . Do not consume any caffeinated/decaffeinated beverages or chocolate 12 hours prior to your test. . Do not take any antihistamines 12 hours prior to your test.  On the Day of the Test: . Drink plenty of water. Do not drink any water within one hour of the test. . Do not eat any food 4 hours prior  to the test. . You may take your regular medications prior to the test.         After the Test: . Drink plenty of water. . After receiving IV contrast, you may experience a mild flushed feeling. This is normal. . On occasion, you may experience a mild rash up to 24 hours after the test. This is not dangerous. If this occurs, you can take Benadryl 25 mg and increase your fluid intake. . If you experience trouble breathing, this can be serious. If it is severe call 911 IMMEDIATELY. If it is mild, please call our office.    Please contact the cardiac imaging nurse navigator should you have any questions/concerns Rockwell AlexandriaSara Wallace, RN Navigator Cardiac Imaging [facility] [phone] Office [phone] Cell       Signed, Kristeen MissPhilip Nahser, MD  01/31/2019 5:57 PM    [facility]

## 2019-01-31 NOTE — Progress Notes (Signed)
Cardiology Office Note:    Date:  01/31/2019   ID:  Jason Suarez, DOB 08-Jun-1956, MRN [id number]  PCP:  Jason Nip, MD  Cardiologist:  Jason Suarez  Electrophysiologist:  None   Referring MD: Jason Nip, MD   Chief Complaint  Patient presents with  . Hypertension  . Hyperlipidemia  . Chest Pain    Aug. 19, 2020    Jason Suarez is a 63 y.o. male with a hx of HTN, hyperlipidemia, chest pain I last saw him in 2013. Has developed cp with push mowing or doing other exercise.  Pain resolves after several minutes.    CP is a tightness Chest tightness is associated with diaphoresis,  Light headedness.  No syncope  He stops exercising and the pain resolves.  This pain is different from previous CP  Occurs every time he exercises.  Progressive over the past 6 months .   Pain is not related to twisting / turning.   Not related to eating, or taking a dee breath  Tries to avoid salty foods.  Recent cholesterol panel looks good.  His total cholesterol is 99.  The HDL is 37.  The LDL is 53.  The triglyceride level is 73.  + family hx of CAD  Brother had MI at age 29.   Father had MI at age 21   Past Medical History:  Diagnosis Date  . Anxiety   . Back pain   . Chest pain   . Chest pressure 03/03/2012  . Dizziness   . DM (diabetes mellitus) ([place])   . Hypercholesterolemia   . Hypertension   . Light headedness   . Low HDL (under 40)   . Overweight   . RBBB 03/03/2012  . Syncopal episodes     Past Surgical History:  Procedure Laterality Date  . KNEE ARTHROSCOPY     left  . SHOULDER SURGERY     right   . SURGERY SCROTAL / TESTICULAR      Current Medications: Current Meds  Medication Sig  . aspirin 81 MG tablet Take 81 mg by mouth daily.  Marland Kitchen atorvastatin (LIPITOR) 20 MG tablet Take 20 mg by mouth daily.  . fish oil-omega-3 fatty acids 1000 MG capsule Take 2 g by mouth daily.  Marland Kitchen losartan (COZAAR) 50 MG tablet Take 1 tablet by mouth daily.  . Multiple  Vitamins-Minerals (MULTIVITAMINS THER. W/MINERALS) TABS Take 1 tablet by mouth daily.     Allergies:   Lisinopril   Social History   Socioeconomic History  . Marital status: Married    Spouse name: Not on file  . Number of children: Not on file  . Years of education: Not on file  . Highest education level: Not on file  Occupational History  . Not on file  Social Needs  . Financial resource strain: Not on file  . Food insecurity    Worry: Not on file    Inability: Not on file  . Transportation needs    Medical: Not on file    Non-medical: Not on file  Tobacco Use  . Smoking status: Never Smoker  . Smokeless tobacco: Never Used  Substance and Sexual Activity  . Alcohol use: Yes  . Drug use: Never  . Sexual activity: Yes    Comment: MARRIED  Lifestyle  . Physical activity    Days per week: Not on file    Minutes per session: Not on file  . Stress: Not on file  Relationships  . Social  connections    Talks on phone: Not on file    Gets together: Not on file    Attends religious service: Not on file    Active member of club or organization: Not on file    Attends meetings of clubs or organizations: Not on file    Relationship status: Not on file  Other Topics Concern  . Not on file  Social History Narrative  . Not on file     Family History: The patient's family history includes Coronary artery disease in his father; Diabetes in his mother; Heart attack in his father; Heart disease in his brother; Hypertension in his father and mother.  ROS:   Please see the history of present illness.     All other systems reviewed and are negative.  EKGs/Labs/Other Studies Reviewed:    The following studies were reviewed today:   EKG:  EKG is  ordered today.  The ekg ordered today demonstrates :  Sinus brady at 49 ,  RBBB  Recent Labs: No results found for requested labs within last 8760 hours.  Recent Lipid Panel No results found for: CHOL, TRIG, HDL, CHOLHDL, VLDL,  LDLCALC, LDLDIRECT  Physical Exam:    VS:  BP (!) 142/78   Pulse (!) 51   Ht 5\' 5"  (1.651 m)   Wt 151 lb 1.9 oz (68.5 kg)   SpO2 98%   BMI 25.15 kg/m     Wt Readings from Last 3 Encounters:  01/31/19 151 lb 1.9 oz (68.5 kg)  03/03/12 156 lb 1.9 oz (70.8 kg)  10/06/09 160 lb (72.6 kg)     GEN:  Well nourished, well developed in no acute distress HEENT: Normal NECK: No JVD; No carotid bruits LYMPHATICS: No lymphadenopathy CARDIAC: RRR, no murmurs, rubs, gallops RESPIRATORY:  Clear to auscultation without rales, wheezing or rhonchi  ABDOMEN: Soft, non-tender, non-distended MUSCULOSKELETAL:  No edema; No deformity  SKIN: Warm and dry NEUROLOGIC:  Alert and oriented x 3 PSYCHIATRIC:  Normal affect   ASSESSMENT:    1. Precordial pain    PLAN:    In order of problems listed above:  1. Chest tightness:  Mr. Jason Suarez has developed chest pressure with exertion.   He has had several types of CP in the past and has had negative stress testing .   His syptoms sound c/w angina .   I would like to get a coronary CT angiogram for further eval.  2.  Hyperlipidemia :   Cont meds.  Last levels look good    Medication Adjustments/Labs and Tests Ordered: Current medicines are reviewed at length with the patient today.  Concerns regarding medicines are outlined above.  Orders Placed This Encounter  Procedures  . CT CORONARY MORPH W/CTA COR W/SCORE W/CA W/CM &/OR WO/CM  . CT CORONARY FRACTIONAL FLOW RESERVE DATA PREP  . CT CORONARY FRACTIONAL FLOW RESERVE FLUID ANALYSIS  . Basic metabolic panel  . EKG 12-Lead   Meds ordered this encounter  Medications  . nitroGLYCERIN (NITROSTAT) 0.4 MG SL tablet    Sig: Place 1 tablet (0.4 mg total) under the tongue every 5 (five) minutes as needed for chest pain.    Dispense:  25 tablet    Refill:  3    Patient Instructions  Medication Instructions:  Your physician has recommended you make the following change in your medication:   A  prescription for Sublingual Nitroglycerin has been called in to your pharmacy. Use as directed  If you need a refill  on your cardiac medications before your next appointment, please call your pharmacy.   Lab work: TODAY: BMET  If you have labs (blood work) drawn today and your tests are completely normal, you will receive your results only by: Marland Kitchen. MyChart Message (if you have MyChart) OR . A paper copy in the mail If you have any lab test that is abnormal or we need to change your treatment, we will call you to review the results.  Testing/Procedures: Your physician has requested that you have cardiac CT. Cardiac computed tomography (CT) is a painless test that uses an x-ray machine to take clear, detailed pictures of your heart. For further information please visit [email or web address]. Please follow instruction sheet as given.  Follow-Up: . Follow up with Dr. Elease HashimotoNahser on 05/08/19 at 2:00 PM  Any Other Special Instructions Will Be Listed Below (If Applicable).  CARDIAC CT INSTRUCTIONS  Your cardiac CT will be scheduled at one of the below locations:   [facility] [address] [place], [place] [address] [phone]  OR  [facility] [address] [place], [place] [address] [phone]  Please arrive at the [facility] main entrance of [facility] 30-45 minutes prior to test start time. Proceed to the [facility] Radiology Department (first floor) to check-in and test prep.  Please follow these instructions carefully (unless otherwise directed):   On the Night Before the Test: . Be sure to Drink plenty of water. . Do not consume any caffeinated/decaffeinated beverages or chocolate 12 hours prior to your test. . Do not take any antihistamines 12 hours prior to your test.  On the Day of the Test: . Drink plenty of water. Do not drink any water within one hour of the test. . Do not eat any food 4 hours prior  to the test. . You may take your regular medications prior to the test.         After the Test: . Drink plenty of water. . After receiving IV contrast, you may experience a mild flushed feeling. This is normal. . On occasion, you may experience a mild rash up to 24 hours after the test. This is not dangerous. If this occurs, you can take Benadryl 25 mg and increase your fluid intake. . If you experience trouble breathing, this can be serious. If it is severe call 911 IMMEDIATELY. If it is mild, please call our office.    Please contact the cardiac imaging nurse navigator should you have any questions/concerns Rockwell AlexandriaSara Wallace, RN Navigator Cardiac Imaging [facility] [phone] Office [phone] Cell       Signed, Kristeen MissPhilip Jefte Carithers, MD  01/31/2019 5:57 PM    [facility]

## 2019-01-31 NOTE — Patient Instructions (Addendum)
Medication Instructions:  Your physician has recommended you make the following change in your medication:   A prescription for Sublingual Nitroglycerin has been called in to your pharmacy. Use as directed  If you need a refill on your cardiac medications before your next appointment, please call your pharmacy.   Lab work: TODAY: BMET  If you have labs (blood work) drawn today and your tests are completely normal, you will receive your results only by: Marland Kitchen MyChart Message (if you have MyChart) OR . A paper copy in the mail If you have any lab test that is abnormal or we need to change your treatment, we will call you to review the results.  Testing/Procedures: Your physician has requested that you have cardiac CT. Cardiac computed tomography (CT) is a painless test that uses an x-ray machine to take clear, detailed pictures of your heart. For further information please visit [email or web address]. Please follow instruction sheet as given.  Follow-Up: . Follow up with Dr. Acie Fredrickson on 05/08/19 at 2:00 PM  Any Other Special Instructions Will Be Listed Below (If Applicable).  CARDIAC CT INSTRUCTIONS  Your cardiac CT will be scheduled at one of the below locations:   [facility] [address] [place], [place] [address] [phone] [place] [address] [place], [place] [address] [phone]  Please arrive at the [facility] main entrance of [facility] 30-45 minutes prior to test start time. Proceed to the [facility] Radiology Department (first floor) to check-in and test prep.  Please follow these instructions carefully (unless otherwise directed):   On the Night Before the Test: . Be sure to Drink plenty of water. . Do not consume any caffeinated/decaffeinated beverages or chocolate 12 hours prior to your test. . Do not take any antihistamines 12 hours prior to your test.  On the Day of the  Test: . Drink plenty of water. Do not drink any water within one hour of the test. . Do not eat any food 4 hours prior to the test. . You may take your regular medications prior to the test.         After the Test: . Drink plenty of water. . After receiving IV contrast, you may experience a mild flushed feeling. This is normal. . On occasion, you may experience a mild rash up to 24 hours after the test. This is not dangerous. If this occurs, you can take Benadryl 25 mg and increase your fluid intake. . If you experience trouble breathing, this can be serious. If it is severe call 911 IMMEDIATELY. If it is mild, please call our office.    Please contact the cardiac imaging nurse navigator should you have any questions/concerns Marchia Bond, RN Navigator Cardiac Imaging [place] [facility] Services [phone] Office [phone] Cell

## 2019-02-01 LAB — BASIC METABOLIC PANEL
BUN/Creatinine Ratio: 21 (ref 10–24)
BUN: 17 mg/dL (ref 8–27)
CO2: 25 mmol/L (ref 20–29)
Calcium: 9.9 mg/dL (ref 8.6–10.2)
Chloride: 105 mmol/L (ref 96–106)
Creatinine, Ser: 0.8 mg/dL (ref 0.76–1.27)
GFR calc Af Amer: 109 mL/min/{1.73_m2} (ref >59)
GFR calc non Af Amer: 94 mL/min/{1.73_m2} (ref >59)
Glucose: 95 mg/dL (ref 65–99)
Potassium: 4.4 mmol/L (ref 3.5–5.2)
Sodium: 142 mmol/L (ref 134–144)

## 2019-02-05 ENCOUNTER — Telehealth: Payer: Self-pay | Admitting: *Deleted

## 2019-02-05 NOTE — Telephone Encounter (Signed)
   [facility] Pre-operative Risk Assessment    Request for surgical clearance:  1. What type of surgery is being performed? RIGHT DE QUERVAINS   2. When is this surgery scheduled? TBD   3. What type of clearance is required (medical clearance vs. Pharmacy clearance to hold med vs. Both)? MEDICAL  4. Are there any medications that need to be held prior to surgery and how long? ASA    5. Practice name and name of physician performing surgery? [facility]; DR. Quillian Quince CAFFREY   6. What is your office phone number [phone] [phone]    7.   What is your office fax number [phone]  8.   Anesthesia type (None, local, MAC, general) ? CHOICE   Jason Suarez 02/05/2019, 3:27 PM  _________________________________________________________________   (provider comments below)

## 2019-02-05 NOTE — Telephone Encounter (Signed)
Plea/se let requesting office know that patient was/ seen 01/31/19 with symptoms concerning for angina. He was scheduled for a CT coronary. Will need to defer medical clearance until after this imaging.

## 2019-02-06 NOTE — Telephone Encounter (Signed)
Informed pt that clearance will be deferred until after Coronary CT has been resulted. Pt verbalized thanks and understanding.

## 2019-02-06 NOTE — Telephone Encounter (Signed)
Faxed via Epic to requesting party, pt still needs to be called

## 2019-02-20 NOTE — Telephone Encounter (Signed)
Note, coronary CT scheduled for 9/11

## 2019-02-22 ENCOUNTER — Telehealth (HOSPITAL_COMMUNITY): Payer: Self-pay | Admitting: Emergency Medicine

## 2019-02-22 NOTE — Telephone Encounter (Signed)
Left message on voicemail with name and callback number Amere Bricco RN Navigator Cardiac Imaging [facility] [phone] Office [phone] Cell  

## 2019-02-23 ENCOUNTER — Other Ambulatory Visit: Payer: Self-pay

## 2019-02-23 ENCOUNTER — Ambulatory Visit (HOSPITAL_COMMUNITY)
Admission: RE | Admit: 2019-02-23 | Discharge: 2019-02-23 | Disposition: A | Discharge status: Home / Self Care | Payer: BC Managed Care – PPO | Source: Ambulatory Visit | Attending: Cardiovascular Disease | Admitting: Cardiovascular Disease

## 2019-02-23 DIAGNOSIS — R072 Precordial pain: Secondary | ICD-10-CM

## 2019-02-23 MED ORDER — NITROGLYCERIN 0.4 MG SL SUBL
SUBLINGUAL_TABLET | SUBLINGUAL | Status: AC
  Filled 2019-02-23: qty 2

## 2019-02-23 MED ORDER — NITROGLYCERIN 0.4 MG SL SUBL
0.8000 mg | SUBLINGUAL_TABLET | Freq: Once | SUBLINGUAL | Status: AC
  Administered 2019-02-23: 0.8 mg via SUBLINGUAL
  Filled 2019-02-23: qty 25

## 2019-02-23 MED ORDER — IOHEXOL 350 MG/ML SOLN
80.0000 mL | Freq: Once | INTRAVENOUS | Status: AC | PRN
  Administered 2019-02-23: 08:00:00 80 mL via INTRAVENOUS

## 2019-02-23 NOTE — Progress Notes (Signed)
Patient ambulatory out of department with steady gait noted. Denies any complaints.  

## 2019-02-23 NOTE — Progress Notes (Signed)
Ct complete. Patient denies any complaints. Offered snack to patient and beverage.

## 2019-02-25 ENCOUNTER — Ambulatory Visit (HOSPITAL_COMMUNITY)
Admission: RE | Admit: 2019-02-25 | Discharge: 2019-02-25 | Disposition: A | Discharge status: Home / Self Care | Payer: BC Managed Care – PPO | Source: Ambulatory Visit | Attending: Cardiovascular Disease | Admitting: Cardiovascular Disease

## 2019-02-25 DIAGNOSIS — R072 Precordial pain: Secondary | ICD-10-CM | POA: Diagnosis not present

## 2019-02-25 DIAGNOSIS — I2510 Atherosclerotic heart disease of native coronary artery without angina pectoris: Secondary | ICD-10-CM

## 2019-02-26 ENCOUNTER — Telehealth: Payer: Self-pay | Admitting: Nurse Practitioner

## 2019-02-26 DIAGNOSIS — I25118 Atherosclerotic heart disease of native coronary artery with other forms of angina pectoris: Secondary | ICD-10-CM

## 2019-02-26 MED ORDER — ISOSORBIDE MONONITRATE ER 30 MG PO TB24: 30.0000 mg | ORAL_TABLET | Freq: Every day | ORAL | 3 refills | Status: DC

## 2019-02-26 NOTE — Telephone Encounter (Signed)
-----   Message from Thayer Headings, MD sent at 02/26/2019 12:28 PM EDT ----- Coronary CT angio reveals a prox LAD stenosis I have called and talked to patient about the procedure, risks, benefits, options. He understands and agrees to proceed.  Please add Imdur 30 mg a day to his meds Also please add NTG 0.4 mg SL PRN chest pain   Please schedule cath this week (his last office visit was Aug. 19)

## 2019-02-26 NOTE — Telephone Encounter (Signed)
You are scheduled for a Cardiac Catheterization on Friday, September 18 with Dr. Harrell Gave End.  1. Please arrive at the [facility] (Main Entrance A) at [facility]: [place], [place] [address] at 8:30 AM (This time is two hours before your procedure to ensure your preparation). Free valet parking service is available.   Special note: Every effort is made to have your procedure done on time. Please understand that emergencies sometimes delay scheduled procedures.  2. Diet: Do not eat solid foods after midnight.  The patient may have clear liquids until 5am upon the day of the procedure.  3. Labs: You will need to have blood drawn on Tuesday, September 15 at [facility]. [address]  Open: 7:30am - 5pm    Phone: [phone]. You do not need to be fasting.  Your Pre-procedure COVID-19 Testing will be done on Tuesday Sept. 15 at [address] [facility], [place]  At 1:50 pm After your swab you will be given a mask to wear and instructed to go home and quarantine/no visitors until after your procedure. If you test positive you will be notified and your procedure will be cancelled.    4. Medication instructions in preparation for your procedure:   Contrast Allergy: No  On the morning of your procedure, take your Aspirin and any morning medicines NOT listed above.  You may use sips of water.  5. Plan for one night stay--bring personal belongings. 6. Bring a current list of your medications and current insurance cards. 7. You MUST have a responsible person to drive you home. 8. Someone MUST be with you the first 24 hours after you arrive home or your discharge will be delayed. 9. Please wear clothes that are easy to get on and off and wear slip-on shoes.  Thank you for allowing [place] to care for you!   -- [facility]

## 2019-02-26 NOTE — Telephone Encounter (Signed)
Coronary CT found to have possible significant LAD lesion per FFR with recommendations for cardiac catheterization which is being performed today, 02/26/2019.   Kathyrn Drown NP-C [place] Pager: [phone]

## 2019-02-27 ENCOUNTER — Encounter: Payer: Self-pay | Admitting: *Deleted

## 2019-02-27 ENCOUNTER — Other Ambulatory Visit: Payer: BC Managed Care – PPO

## 2019-02-27 ENCOUNTER — Other Ambulatory Visit (HOSPITAL_COMMUNITY)
Admission: RE | Admit: 2019-02-27 | Discharge: 2019-02-27 | Disposition: A | Discharge status: Home / Self Care | Payer: BC Managed Care – PPO | Source: Ambulatory Visit | Attending: Internal Medicine | Admitting: Internal Medicine

## 2019-02-27 ENCOUNTER — Other Ambulatory Visit: Payer: Self-pay

## 2019-02-27 DIAGNOSIS — I25118 Atherosclerotic heart disease of native coronary artery with other forms of angina pectoris: Secondary | ICD-10-CM

## 2019-02-27 DIAGNOSIS — Z01812 Encounter for preprocedural laboratory examination: Secondary | ICD-10-CM | POA: Diagnosis not present

## 2019-02-27 DIAGNOSIS — Z20828 Contact with and (suspected) exposure to other viral communicable diseases: Secondary | ICD-10-CM | POA: Diagnosis present

## 2019-02-28 LAB — CBC
Hematocrit: 38.9 % (ref 37.5–51.0)
Hemoglobin: 13.3 g/dL (ref 13.0–17.7)
MCH: 31.7 pg (ref 26.6–33.0)
MCHC: 34.2 g/dL (ref 31.5–35.7)
MCV: 93 fL (ref 79–97)
Platelets: 203 10*3/uL (ref 150–450)
RBC: 4.2 x10E6/uL (ref 4.14–5.80)
RDW: 12.7 % (ref 11.6–15.4)
WBC: 7.4 10*3/uL (ref 3.4–10.8)

## 2019-02-28 LAB — BASIC METABOLIC PANEL
BUN/Creatinine Ratio: 13 (ref 10–24)
BUN: 10 mg/dL (ref 8–27)
CO2: 24 mmol/L (ref 20–29)
Calcium: 9.9 mg/dL (ref 8.6–10.2)
Chloride: 105 mmol/L (ref 96–106)
Creatinine, Ser: 0.8 mg/dL (ref 0.76–1.27)
GFR calc Af Amer: 109 mL/min/{1.73_m2} (ref >59)
GFR calc non Af Amer: 94 mL/min/{1.73_m2} (ref >59)
Glucose: 106 mg/dL — ABNORMAL HIGH (ref 65–99)
Potassium: 4.9 mmol/L (ref 3.5–5.2)
Sodium: 142 mmol/L (ref 134–144)

## 2019-02-28 LAB — NOVEL CORONAVIRUS, NAA (HOSP ORDER, SEND-OUT TO REF LAB; TAT 18-24 HRS): SARS-CoV-2, NAA: NOT DETECTED

## 2019-03-01 ENCOUNTER — Telehealth: Payer: Self-pay | Admitting: Cardiovascular Disease

## 2019-03-01 ENCOUNTER — Telehealth: Payer: Self-pay | Admitting: *Deleted

## 2019-03-01 NOTE — Telephone Encounter (Signed)
Pt contacted pre-catheterization scheduled at [facility] for: Left heart catheterization with Dr. Saunders Revel on 03/02/2019. Verified arrival time and place: [facility]) at: 8:30 am.   No solid food after midnight prior to cath, clear liquids until 5 AM day of procedure. Contrast allergy: None Verified no diabetes medications.  AM meds can be  taken pre-cath with sip of water including: ASA 81 mg   Confirmed patient has responsible person to drive home post procedure and observe 24 hours after arriving home: yes  Currently, due to Covid-19 pandemic, only one support person will be allowed with patient. Must be the same support person for that patient's entire stay, will be screened and required to wear a mask. They will be asked to wait in the waiting room for the duration of the patient's stay.  Patients are required to wear a mask when they enter the hospital.       COVID-19 Pre-Screening Questions:  . In the past 7 to 10 days have you had a cough,  shortness of breath, headache, congestion, fever (100 or greater) body aches, chills, sore throat, or sudden loss of taste or sense of smell? No . Have you been around anyone with known Covid 19? No . Have you been around anyone who is awaiting Covid 19 test results in the past 7 to 10 days? No . Have you been around anyone who has been exposed to Covid 19, or has mentioned symptoms of Covid 19 within the past 7 to 10 days? No

## 2019-03-01 NOTE — Telephone Encounter (Signed)
New Message    Pt c/o medication issue:  1. Name of Medication: Isosorbide Mononitrate  2. How are you currently taking this medication (dosage and times per day)? 1 tablet by mouth daily   3. Are you having a reaction (difficulty breathing--STAT)? No  4. What is your medication issue? Patient has cath appointment coming up tomorrow and the medication has been giving him a headache so he wants to know if it's okay to take tylenol.  Please call patient back to advise.

## 2019-03-01 NOTE — Telephone Encounter (Signed)
Returned call to patient and advised him that he may use Tylenol for headache associated with starting isosorbide. He states Barnetta Chapel, RN called him earlier and advised him that he could take an Advil, which he has already done. He is aware of stable lab results for cath and to take ASA 81 mg tomorrow morning prior to cath. He thanked me for the call.

## 2019-03-02 ENCOUNTER — Ambulatory Visit (HOSPITAL_COMMUNITY)
Admission: RE | Admit: 2019-03-02 | Discharge: 2019-03-03 | Disposition: A | Discharge status: Home / Self Care | Payer: BC Managed Care – PPO | Attending: Internal Medicine | Admitting: Internal Medicine

## 2019-03-02 ENCOUNTER — Other Ambulatory Visit: Payer: Self-pay

## 2019-03-02 ENCOUNTER — Ambulatory Visit (HOSPITAL_COMMUNITY)
Admission: RE | Disposition: A | Discharge status: Home / Self Care | Payer: Self-pay | Source: Home / Self Care | Attending: Internal Medicine

## 2019-03-02 DIAGNOSIS — I25110 Atherosclerotic heart disease of native coronary artery with unstable angina pectoris: Secondary | ICD-10-CM | POA: Diagnosis not present

## 2019-03-02 DIAGNOSIS — Z7902 Long term (current) use of antithrombotics/antiplatelets: Secondary | ICD-10-CM | POA: Diagnosis not present

## 2019-03-02 DIAGNOSIS — I209 Angina pectoris, unspecified: Secondary | ICD-10-CM | POA: Diagnosis present

## 2019-03-02 DIAGNOSIS — I4510 Unspecified right bundle-branch block: Secondary | ICD-10-CM | POA: Diagnosis not present

## 2019-03-02 DIAGNOSIS — I10 Essential (primary) hypertension: Secondary | ICD-10-CM | POA: Diagnosis not present

## 2019-03-02 DIAGNOSIS — E119 Type 2 diabetes mellitus without complications: Secondary | ICD-10-CM | POA: Insufficient documentation

## 2019-03-02 DIAGNOSIS — R931 Abnormal findings on diagnostic imaging of heart and coronary circulation: Secondary | ICD-10-CM | POA: Diagnosis present

## 2019-03-02 DIAGNOSIS — Z888 Allergy status to other drugs, medicaments and biological substances status: Secondary | ICD-10-CM | POA: Diagnosis not present

## 2019-03-02 DIAGNOSIS — I25118 Atherosclerotic heart disease of native coronary artery with other forms of angina pectoris: Secondary | ICD-10-CM | POA: Diagnosis present

## 2019-03-02 DIAGNOSIS — I200 Unstable angina: Secondary | ICD-10-CM | POA: Diagnosis present

## 2019-03-02 DIAGNOSIS — Z955 Presence of coronary angioplasty implant and graft: Secondary | ICD-10-CM | POA: Insufficient documentation

## 2019-03-02 DIAGNOSIS — Z79899 Other long term (current) drug therapy: Secondary | ICD-10-CM | POA: Insufficient documentation

## 2019-03-02 DIAGNOSIS — Z791 Long term (current) use of non-steroidal anti-inflammatories (NSAID): Secondary | ICD-10-CM | POA: Insufficient documentation

## 2019-03-02 DIAGNOSIS — F419 Anxiety disorder, unspecified: Secondary | ICD-10-CM | POA: Diagnosis not present

## 2019-03-02 DIAGNOSIS — I2510 Atherosclerotic heart disease of native coronary artery without angina pectoris: Secondary | ICD-10-CM | POA: Diagnosis present

## 2019-03-02 DIAGNOSIS — Z7982 Long term (current) use of aspirin: Secondary | ICD-10-CM | POA: Insufficient documentation

## 2019-03-02 DIAGNOSIS — Z95820 Peripheral vascular angioplasty status with implants and grafts: Secondary | ICD-10-CM

## 2019-03-02 HISTORY — PX: INTRAVASCULAR ULTRASOUND/IVUS: CATH118244

## 2019-03-02 HISTORY — PX: CORONARY STENT INTERVENTION: CATH118234

## 2019-03-02 HISTORY — PX: LEFT HEART CATH AND CORONARY ANGIOGRAPHY: CATH118249

## 2019-03-02 HISTORY — PX: CORONARY ATHERECTOMY: CATH118238

## 2019-03-02 LAB — GLUCOSE, CAPILLARY: Glucose-Capillary: 118 mg/dL — ABNORMAL HIGH (ref 70–99)

## 2019-03-02 LAB — POCT ACTIVATED CLOTTING TIME
Activated Clotting Time: 274 seconds
Activated Clotting Time: 290 seconds
Activated Clotting Time: 362 seconds
Activated Clotting Time: 307 seconds

## 2019-03-02 LAB — HEMOGLOBIN A1C
Hgb A1c MFr Bld: 6.6 % — ABNORMAL HIGH (ref 4.8–5.6)
Mean Plasma Glucose: 142.72 mg/dL

## 2019-03-02 SURGERY — LEFT HEART CATH AND CORONARY ANGIOGRAPHY
Anesthesia: LOCAL

## 2019-03-02 MED ORDER — FENTANYL CITRATE (PF) 100 MCG/2ML IJ SOLN
INTRAMUSCULAR | Status: DC | PRN
  Administered 2019-03-02 (×2): 25 ug via INTRAVENOUS
  Administered 2019-03-02: 50 ug via INTRAVENOUS

## 2019-03-02 MED ORDER — IOHEXOL 350 MG/ML SOLN
INTRAVENOUS | Status: DC | PRN
  Administered 2019-03-02: 200 mL

## 2019-03-02 MED ORDER — NITROGLYCERIN 0.4 MG SL SUBL
SUBLINGUAL_TABLET | SUBLINGUAL | Status: AC
  Filled 2019-03-02: qty 1

## 2019-03-02 MED ORDER — ENOXAPARIN SODIUM 40 MG/0.4ML ~~LOC~~ SOLN: 40.0000 mg | SUBCUTANEOUS | Status: DC

## 2019-03-02 MED ORDER — ASPIRIN 81 MG PO CHEW: 81.0000 mg | CHEWABLE_TABLET | ORAL | Status: DC

## 2019-03-02 MED ORDER — SODIUM CHLORIDE 0.9% FLUSH: 3.0000 mL | INTRAVENOUS | Status: DC | PRN

## 2019-03-02 MED ORDER — ASPIRIN EC 81 MG PO TBEC
81.0000 mg | DELAYED_RELEASE_TABLET | Freq: Every day | ORAL | Status: DC
  Administered 2019-03-02: 81 mg via ORAL
  Filled 2019-03-02: qty 1

## 2019-03-02 MED ORDER — HEPARIN SODIUM (PORCINE) 1000 UNIT/ML IJ SOLN
INTRAMUSCULAR | Status: DC | PRN
  Administered 2019-03-02: 3500 [IU] via INTRAVENOUS
  Administered 2019-03-02 (×2): 2000 [IU] via INTRAVENOUS
  Administered 2019-03-02: 3500 [IU] via INTRAVENOUS

## 2019-03-02 MED ORDER — SODIUM CHLORIDE 0.9% FLUSH
3.0000 mL | Freq: Two times a day (BID) | INTRAVENOUS | Status: DC
  Administered 2019-03-02: 3 mL via INTRAVENOUS

## 2019-03-02 MED ORDER — VERAPAMIL HCL 2.5 MG/ML IV SOLN
INTRAVENOUS | Status: DC | PRN
  Administered 2019-03-02 (×2): 10 mL via INTRA_ARTERIAL

## 2019-03-02 MED ORDER — SODIUM CHLORIDE 0.9 % IV SOLN: 250.0000 mL | INTRAVENOUS | Status: DC | PRN

## 2019-03-02 MED ORDER — LOSARTAN POTASSIUM 50 MG PO TABS
50.0000 mg | ORAL_TABLET | Freq: Every day | ORAL | Status: DC
  Administered 2019-03-02: 50 mg via ORAL
  Filled 2019-03-02: qty 1

## 2019-03-02 MED ORDER — SODIUM CHLORIDE 0.9 % IV SOLN: INTRAVENOUS | Status: AC

## 2019-03-02 MED ORDER — HEPARIN (PORCINE) IN NACL 1000-0.9 UT/500ML-% IV SOLN
INTRAVENOUS | Status: AC
  Filled 2019-03-02: qty 500

## 2019-03-02 MED ORDER — CLOPIDOGREL BISULFATE 75 MG PO TABS
75.0000 mg | ORAL_TABLET | Freq: Every day | ORAL | Status: DC
  Administered 2019-03-03: 75 mg via ORAL
  Filled 2019-03-02: qty 1

## 2019-03-02 MED ORDER — HYDRALAZINE HCL 20 MG/ML IJ SOLN: 10.0000 mg | INTRAMUSCULAR | Status: AC | PRN

## 2019-03-02 MED ORDER — FENTANYL CITRATE (PF) 100 MCG/2ML IJ SOLN
INTRAMUSCULAR | Status: AC
  Filled 2019-03-02: qty 2

## 2019-03-02 MED ORDER — ISOSORBIDE MONONITRATE ER 30 MG PO TB24
30.0000 mg | ORAL_TABLET | Freq: Every day | ORAL | Status: DC
  Administered 2019-03-03: 30 mg via ORAL
  Filled 2019-03-02: qty 1

## 2019-03-02 MED ORDER — NITROGLYCERIN 1 MG/10 ML FOR IR/CATH LAB
INTRA_ARTERIAL | Status: AC
  Filled 2019-03-02: qty 10

## 2019-03-02 MED ORDER — NITROGLYCERIN 0.4 MG SL SUBL
SUBLINGUAL_TABLET | SUBLINGUAL | Status: DC | PRN
  Administered 2019-03-02: .4 mg via SUBLINGUAL

## 2019-03-02 MED ORDER — ASPIRIN 81 MG PO TABS: 81.0000 mg | ORAL_TABLET | Freq: Every day | ORAL | Status: DC

## 2019-03-02 MED ORDER — HEPARIN (PORCINE) IN NACL 1000-0.9 UT/500ML-% IV SOLN
INTRAVENOUS | Status: AC
  Filled 2019-03-02: qty 1000

## 2019-03-02 MED ORDER — ONDANSETRON HCL 4 MG/2ML IJ SOLN: 4.0000 mg | Freq: Four times a day (QID) | INTRAMUSCULAR | Status: DC | PRN

## 2019-03-02 MED ORDER — LABETALOL HCL 5 MG/ML IV SOLN: 10.0000 mg | INTRAVENOUS | Status: AC | PRN

## 2019-03-02 MED ORDER — INSULIN ASPART 100 UNIT/ML ~~LOC~~ SOLN: 0.0000 [IU] | Freq: Three times a day (TID) | SUBCUTANEOUS | Status: DC

## 2019-03-02 MED ORDER — NITROGLYCERIN 1 MG/10 ML FOR IR/CATH LAB
INTRA_ARTERIAL | Status: DC | PRN
  Administered 2019-03-02: 200 ug
  Administered 2019-03-02 (×3): 200 ug via INTRACORONARY

## 2019-03-02 MED ORDER — ATORVASTATIN CALCIUM 10 MG PO TABS
20.0000 mg | ORAL_TABLET | Freq: Every day | ORAL | Status: DC
  Administered 2019-03-02: 20 mg via ORAL
  Filled 2019-03-02: qty 2

## 2019-03-02 MED ORDER — SODIUM CHLORIDE 0.9% FLUSH: 3.0000 mL | Freq: Two times a day (BID) | INTRAVENOUS | Status: DC

## 2019-03-02 MED ORDER — PRASUGREL HCL 10 MG PO TABS
ORAL_TABLET | ORAL | Status: AC
  Filled 2019-03-02: qty 6

## 2019-03-02 MED ORDER — MIDAZOLAM HCL 2 MG/2ML IJ SOLN
INTRAMUSCULAR | Status: DC | PRN
  Administered 2019-03-02 (×2): 1 mg via INTRAVENOUS

## 2019-03-02 MED ORDER — HEPARIN SODIUM (PORCINE) 1000 UNIT/ML IJ SOLN
INTRAMUSCULAR | Status: AC
  Filled 2019-03-02: qty 1

## 2019-03-02 MED ORDER — SODIUM CHLORIDE 0.9 % WEIGHT BASED INFUSION: 1.0000 mL/kg/h | INTRAVENOUS | Status: DC

## 2019-03-02 MED ORDER — THE SENSUOUS HEART BOOK
Freq: Once | Status: AC
  Administered 2019-03-03: 04:00:00
  Filled 2019-03-02: qty 1

## 2019-03-02 MED ORDER — MIDAZOLAM HCL 2 MG/2ML IJ SOLN
INTRAMUSCULAR | Status: AC
  Filled 2019-03-02: qty 2

## 2019-03-02 MED ORDER — ANGIOPLASTY BOOK
Freq: Once | Status: AC
  Administered 2019-03-03: 04:00:00
  Filled 2019-03-02: qty 1

## 2019-03-02 MED ORDER — LIDOCAINE HCL (PF) 1 % IJ SOLN
INTRAMUSCULAR | Status: AC
  Filled 2019-03-02: qty 30

## 2019-03-02 MED ORDER — LIDOCAINE HCL (PF) 1 % IJ SOLN
INTRAMUSCULAR | Status: DC | PRN
  Administered 2019-03-02: 2 mL

## 2019-03-02 MED ORDER — VERAPAMIL HCL 2.5 MG/ML IV SOLN
INTRAVENOUS | Status: AC
  Filled 2019-03-02: qty 2

## 2019-03-02 MED ORDER — PRASUGREL HCL 10 MG PO TABS
ORAL_TABLET | ORAL | Status: AC
  Filled 2019-03-02: qty 1

## 2019-03-02 MED ORDER — SODIUM CHLORIDE 0.9 % WEIGHT BASED INFUSION
3.0000 mL/kg/h | INTRAVENOUS | Status: DC
  Administered 2019-03-02: 3 mL/kg/h via INTRAVENOUS

## 2019-03-02 MED ORDER — NITROGLYCERIN 0.4 MG SL SUBL: 0.4000 mg | SUBLINGUAL_TABLET | SUBLINGUAL | Status: DC | PRN

## 2019-03-02 MED ORDER — HEPARIN (PORCINE) IN NACL 1000-0.9 UT/500ML-% IV SOLN
INTRAVENOUS | Status: DC | PRN
  Administered 2019-03-02 (×2): 500 mL

## 2019-03-02 MED ORDER — ACETAMINOPHEN 325 MG PO TABS
650.0000 mg | ORAL_TABLET | ORAL | Status: DC | PRN
  Administered 2019-03-02: 650 mg via ORAL
  Filled 2019-03-02: qty 2

## 2019-03-02 SURGICAL SUPPLY — 29 items
BALLN MINITREK OTW 2.0X12 (BALLOONS) ×2
BALLN SAPPHIRE 1.5X12 (BALLOONS) ×2
BALLN SAPPHIRE 2.5X15 (BALLOONS) ×2
BALLN SAPPHIRE ~~LOC~~ 3.25X18 (BALLOONS) ×1 IMPLANT
BALLOON MINITREK OTW 2.0X12 (BALLOONS) IMPLANT
BALLOON SAPPHIRE 1.5X12 (BALLOONS) IMPLANT
BALLOON SAPPHIRE 2.5X15 (BALLOONS) IMPLANT
CATH 5FR JL3.5 JR4 ANG PIG MP (CATHETERS) ×1 IMPLANT
CATH OPTICROSS 40MHZ (CATHETERS) ×1 IMPLANT
CATH VISTA GUIDE 6FR XBLAD3.5 (CATHETERS) ×1 IMPLANT
CROWN DIAMONDBACK CLASSIC 1.25 (BURR) ×1 IMPLANT
DEVICE RAD COMP TR BAND LRG (VASCULAR PRODUCTS) ×1 IMPLANT
GLIDESHEATH SLEND SS 6F .021 (SHEATH) ×1 IMPLANT
GUIDEWIRE INQWIRE 1.5J.035X260 (WIRE) IMPLANT
INQWIRE 1.5J .035X260CM (WIRE) ×2
KIT ENCORE 26 ADVANTAGE (KITS) ×1 IMPLANT
KIT HEART LEFT (KITS) ×2 IMPLANT
KIT HEMO VALVE WATCHDOG (MISCELLANEOUS) ×1 IMPLANT
LUBRICANT VIPERSLIDE CORONARY (MISCELLANEOUS) ×1 IMPLANT
PACK CARDIAC CATHETERIZATION (CUSTOM PROCEDURE TRAY) ×2 IMPLANT
SLED PULL BACK IVUS (MISCELLANEOUS) ×1 IMPLANT
STENT RESOLUTE ONYX 3.0X34 (Permanent Stent) ×1 IMPLANT
SYR MEDRAD MARK 7 150ML (SYRINGE) ×2 IMPLANT
TRANSDUCER W/STOPCOCK (MISCELLANEOUS) ×2 IMPLANT
TUBING CIL FLEX 10 FLL-RA (TUBING) ×2 IMPLANT
WIRE PT2 MS 185 (WIRE) ×1 IMPLANT
WIRE RUNTHROUGH .014X180CM (WIRE) ×2 IMPLANT
WIRE RUNTHROUGH EXTENSION (WIRE) ×1 IMPLANT
WIRE VIPERWIRE COR FLEX .012 (WIRE) ×1 IMPLANT

## 2019-03-02 NOTE — Brief Op Note (Addendum)
BRIEF CARDIAC CATHETERIZATION NOTE  03/02/2019  1:16 PM  PATIENT:  Mahmoud Reece Levy  63 y.o. male  PRE-OPERATIVE DIAGNOSIS:  abnormal coronary CT  POST-OPERATIVE DIAGNOSIS:  * No post-op diagnosis entered *  PROCEDURE:  Procedure(s): LEFT HEART CATH AND CORONARY ANGIOGRAPHY (N/A) CORONARY ATHERECTOMY (N/A) CORONARY STENT INTERVENTION (N/A) Intravascular Ultrasound/IVUS (N/A)  SURGEON:  Surgeon(s) and Role:    * Lyrah Bradt, Harrell Gave, MD - Primary  FINDINGS: 1. Severe single-vessel coronary artery disease with sequential 70% and 50% proximal/mid LAD stenosis, previously shown to be hemodynamically significant by CT FFR. 2. Moderate, non-obstructive CAD involving the LCx and RCA. 3. Mildly elevated LVEDP with normal LVEF. 4. Successful IVUS-guided orbital atherectomy and PCI to the proximal/mid LAD using Resolute Onyx 3.0 x 34 mm drug-eluting stent with 0% residual stenosis and TIMI-3 flow. 5. Small D2 lesion with 70% ostial stenosis was jailed by the stent with resultant loss of flow.  Occlusion was successfully treated with a 1.5 x 12 mm balloon, though a non-flow-limiting dissection occurred.  The vessel is too small for stent placement.  RECOMMENDATIONS: 1. Overnight extended recovery. 2. Dual antiplatelet therapy with aspirin and clopidogrel for at least 6 months. 3. Aggressive secondary prevention.  Nelva Bush, MD [facility] Pager: [phone]

## 2019-03-02 NOTE — Interval H&P Note (Signed)
History and Physical Interval Note:  03/02/2019 10:39 AM  Jason Suarez  has presented today for cardiac catheterization, with the diagnosis of accelerating angina and abnormal coronary CT.  The various methods of treatment have been discussed with the patient and family. After consideration of risks, benefits and other options for treatment, the patient has consented to  Procedure(s): LEFT HEART CATH AND CORONARY ANGIOGRAPHY (N/A) as a surgical intervention.  The patient's history has been reviewed, patient examined, no change in status, stable for surgery.  I have reviewed the patient's chart and labs.  Questions were answered to the patient's satisfaction.    Cath Lab Visit (complete for each Cath Lab visit)  Clinical Evaluation Leading to the Procedure:   ACS: No.  Non-ACS:    Anginal Classification: CCS II  Anti-ischemic medical therapy: Minimal Therapy (1 class of medications)  Non-Invasive Test Results: High-risk stress test findings: cardiac mortality >3%/year (significant proximal LAD stenosis by CT FFR).  Prior CABG: No previous CABG  Devlynn Knoff

## 2019-03-03 ENCOUNTER — Other Ambulatory Visit: Payer: Self-pay

## 2019-03-03 ENCOUNTER — Encounter (HOSPITAL_COMMUNITY): Payer: Self-pay | Admitting: Cardiology

## 2019-03-03 DIAGNOSIS — I25118 Atherosclerotic heart disease of native coronary artery with other forms of angina pectoris: Secondary | ICD-10-CM | POA: Diagnosis present

## 2019-03-03 DIAGNOSIS — I25110 Atherosclerotic heart disease of native coronary artery with unstable angina pectoris: Secondary | ICD-10-CM | POA: Diagnosis not present

## 2019-03-03 DIAGNOSIS — Z95820 Peripheral vascular angioplasty status with implants and grafts: Secondary | ICD-10-CM

## 2019-03-03 DIAGNOSIS — E783 Hyperchylomicronemia: Secondary | ICD-10-CM

## 2019-03-03 DIAGNOSIS — I25119 Atherosclerotic heart disease of native coronary artery with unspecified angina pectoris: Secondary | ICD-10-CM

## 2019-03-03 DIAGNOSIS — Z7902 Long term (current) use of antithrombotics/antiplatelets: Secondary | ICD-10-CM | POA: Diagnosis not present

## 2019-03-03 DIAGNOSIS — I4510 Unspecified right bundle-branch block: Secondary | ICD-10-CM | POA: Diagnosis not present

## 2019-03-03 DIAGNOSIS — I10 Essential (primary) hypertension: Secondary | ICD-10-CM | POA: Diagnosis not present

## 2019-03-03 DIAGNOSIS — I209 Angina pectoris, unspecified: Secondary | ICD-10-CM

## 2019-03-03 DIAGNOSIS — I2510 Atherosclerotic heart disease of native coronary artery without angina pectoris: Secondary | ICD-10-CM

## 2019-03-03 HISTORY — DX: Peripheral vascular angioplasty status with implants and grafts: Z95.820

## 2019-03-03 HISTORY — DX: Atherosclerotic heart disease of native coronary artery without angina pectoris: I25.10

## 2019-03-03 LAB — BASIC METABOLIC PANEL
Anion gap: 13 (ref 5–15)
BUN: 10 mg/dL (ref 8–23)
CO2: 20 mmol/L — ABNORMAL LOW (ref 22–32)
Calcium: 9 mg/dL (ref 8.9–10.3)
Chloride: 105 mmol/L (ref 98–111)
Creatinine, Ser: 0.73 mg/dL (ref 0.61–1.24)
GFR calc Af Amer: >60 mL/min (ref >60)
GFR calc non Af Amer: >60 mL/min (ref >60)
Glucose, Bld: 116 mg/dL — ABNORMAL HIGH (ref 70–99)
Potassium: 3.7 mmol/L (ref 3.5–5.1)
Sodium: 138 mmol/L (ref 135–145)

## 2019-03-03 LAB — CBC
HCT: 38 % — ABNORMAL LOW (ref 39.0–52.0)
Hemoglobin: 12.5 g/dL — ABNORMAL LOW (ref 13.0–17.0)
MCH: 30.9 pg (ref 26.0–34.0)
MCHC: 32.9 g/dL (ref 30.0–36.0)
MCV: 94.1 fL (ref 80.0–100.0)
Platelets: 169 10*3/uL (ref 150–400)
RBC: 4.04 MIL/uL — ABNORMAL LOW (ref 4.22–5.81)
RDW: 12.8 % (ref 11.5–15.5)
WBC: 8.9 10*3/uL (ref 4.0–10.5)
nRBC: 0 % (ref 0.0–0.2)

## 2019-03-03 LAB — GLUCOSE, CAPILLARY: Glucose-Capillary: 113 mg/dL — ABNORMAL HIGH (ref 70–99)

## 2019-03-03 MED ORDER — CLOPIDOGREL BISULFATE 75 MG PO TABS: 75.0000 mg | ORAL_TABLET | Freq: Every day | ORAL | 11 refills | Status: DC

## 2019-03-03 MED ORDER — ASPIRIN 81 MG PO TBEC: 81.0000 mg | DELAYED_RELEASE_TABLET | Freq: Every day | ORAL | Status: DC

## 2019-03-03 MED ORDER — ACETAMINOPHEN 325 MG PO TABS: 650.0000 mg | ORAL_TABLET | ORAL | Status: DC | PRN

## 2019-03-03 NOTE — Plan of Care (Signed)
  Problem: Cardiovascular: Goal: Ability to achieve and maintain adequate cardiovascular perfusion will improve Outcome: Completed/Met Goal: Vascular access site(s) Level 0-1 will be maintained Outcome: Completed/Met

## 2019-03-03 NOTE — Discharge Summary (Signed)
Discharge Summary    Patient ID: Jason Suarez MRN: [id number]; DOB: 1955/10/30  Admit date: 03/02/2019 Discharge date: 03/03/2019  Primary Care Provider: Clayborn Heron, MD  Primary Cardiologist: Kristeen Miss, MD  Primary Electrophysiologist:  None   Discharge Diagnoses    Principal Problem:   New-onset angina [facility]) Active Problems:   CAD in native artery   S/P angioplasty with stent and atherectomy to proximal/mid LAD with Resolute Onyx,  and PTCA to small D2 that was jailed   Abnormal cardiac CT angiography   Accelerating angina (HCC)   Allergies Allergies  Allergen Reactions   Lisinopril Swelling    Lip Inflammation     Diagnostic Studies/Procedures    CORONARY STENT INTERVENTION  CORONARY ATHERECTOMY  Intravascular Ultrasound/IVUS  LEFT HEART CATH AND CORONARY ANGIOGRAPHY   03/02/19 Conclusions: 1. Severe single-vessel coronary artery disease with sequential 70% and 50% proximal/mid LAD stenoses, previously shown to be hemodynamically significant by CT FFR. 2. Moderate, non-obstructive coronary artery disease involving the LCx and RCA. 3. Mildly elevated LVEDP with normal LVEF. 4. Successful IVUS-guided orbital atherectomy and PCI to the proximal/mid LAD using Resolute Onyx 3.0 x 34 mm drug-eluting stent with 0% residual stenosis and TIMI-3 flow. 5. Small D2 lesion with 70% ostial stenosis was jailed by the stent with resultant loss of flow. Occlusion was successfully treated with a 1.5 x 12 mm balloon, though a non-flow-limiting dissection occurred. The vessel is too small for stent placement.  Recommendations: 1. Overnight extended recovery. 2. Dual antiplatelet therapy with aspirin and clopidogrel for at least 6 months, ideally longer if tolerated. 3. Aggressive secondary prevention.  Yvonne Kendall, MD Diagnostic Dominance: Right  Intervention    _____________   History of Present Illness     88 YOM with hx of HTN, HLD, chest pain and  premature FH of CAD with brother at 22 presented to office with new chest pain described as tightness associated with diaphoresis-occurs with exertion and resolves with rest but progressive over last 6 months.  Cardiac CTA was done revealing significant stenosis of LAD with FFR.  LCX no significant stenosis and RCA no significant stenosis.  Pt was contacted and arrangements for cardiac cath and possible stents.  He also was being evaluated for  Rt Berline Lopes procedure.    Pt presented electively for cardiac cath.  Hospital Course     Consultants: none   Cardiac cath with above results and DES to prox to mid LAD and PTCA to D2 lesion that was jailed by the stent and loss of flow.  With PTCA it was treated successfully though a non flow limiting dissection occurred. The vessel was too small for stent placement.   Plan for DAPT for 6 months.   Pt's Berline Lopes surgery will need to be postponed for at least 6 months.   Post op pt has done well.  Cr 0.73 today and K+ 3.7  Hgb 12.5  Pt's hgb A1c was 6.6 EKG SR with RBBB and no acute ST changes  BP stable  Pt on ASA 81 mg, plavix atorvastatin --may be able to stop imdur as outpt. And ARB. Will be ambulated with cardiac rehab prior to discharge.    Has been seen and evaluated by Dr. Diona Browner and plan for discharge home.   _____________  Discharge Vitals Blood pressure 138/71, pulse 60, temperature 98.6 F (37 C), temperature source Oral, resp. rate 19, height 5\' 5"  (1.651 m), weight 67 kg, SpO2 100 %.  Filed Weights  03/02/19 0902 03/02/19 1333 03/03/19 0350  Weight: 68 kg 67.6 kg 67 kg    Labs & Radiologic Studies    CBC Recent Labs    03/03/19 0446  WBC 8.9  HGB 12.5*  HCT 38.0*  MCV 94.1  PLT 169   Basic Metabolic Panel Recent Labs    16/03/9608/19/20 0446  NA 138  K 3.7  CL 105  CO2 20*  GLUCOSE 116*  BUN 10  CREATININE 0.73  CALCIUM 9.0   Liver Function Tests No results for input(s): AST, ALT, ALKPHOS, BILITOT, PROT,  ALBUMIN in the last 72 hours. No results for input(s): LIPASE, AMYLASE in the last 72 hours. High Sensitivity Troponin:   No results for input(s): TROPONINIHS in the last 720 hours.  BNP Invalid input(s): POCBNP D-Dimer No results for input(s): DDIMER in the last 72 hours. Hemoglobin A1C Recent Labs    03/02/19 2034  HGBA1C 6.6*   Fasting Lipid Panel No results for input(s): CHOL, HDL, LDLCALC, TRIG, CHOLHDL, LDLDIRECT in the last 72 hours. Thyroid Function Tests No results for input(s): TSH, T4TOTAL, T3FREE, THYROIDAB in the last 72 hours.  Invalid input(s): FREET3 _____________  Ct Coronary Morph W/cta Cor W/score W/ca W/cm &/or Wo/cm  Addendum Date: 02/23/2019   ADDENDUM REPORT: 02/23/2019 14:45 CLINICAL DATA:  Chest pain EXAM: Cardiac/Coronary CTA TECHNIQUE: The patient was scanned on a [employer] scanner. A 100 kV prospective scan was triggered in the descending thoracic aorta at 111 HU's. Axial non-contrast 3 mm slices were carried out through the heart. The data set was analyzed on a dedicated work station and scored using the Agatson method. Gantry rotation speed was 250 msecs and collimation was .6 mm. No beta blockade and 0.8 mg of sl NTG was given. The 3D data set was reconstructed in 5% intervals of the 35-75 % of the R-R cycle. Diastolic phases were analyzed on a dedicated work station using MPR, MIP and VRT modes. The patient received 80 cc of contrast. FINDINGS: Image quality: excellent. Noise artifact is: Limited. Coronary Arteries:  Normal coronary origin.  Right dominance. Left main: Short without plaque or stenosis. The LAD and LCX almost appear to have separate ostia. Left anterior descending artery: The proximal LAD contains heavy calcification that is circumferential and appears to be mild in severity (25-49%), but it is difficult to exclude a moderate stenosis (50-69%). 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 The mLAD and dLAD are patent without plaque or stenosis.  The LAD gives off 2 patent diagonal branches. Left circumflex artery: The LCX is non-dominant and gives off 1 large OM system. The proximal LCX contains minimal calcified plaque (<25%). The mid segment of the large OM system contains minimal calcified plaque (<25%). The distal OM system contains mild calcified plaque (25-49%). Right coronary artery: The RCA is dominant with normal take off from the right coronary cusp. The proximal segment contains minimal calcified plaque (<25%). The mid RCA contains mild calcified plaque (25-49%). The distal RCA contains minimal calcified plaque (<25%). The RCA terminates as a PDA and right posterolateral branch without evidence of plaque or stenosis. Right Atrium: Right atrial size is within normal limits. Right Ventricle: The right ventricular cavity is within normal limits. Left Atrium: Left atrial size is normal in size with no left atrial appendage filling defect. The IAS appears lipomatous. Left Ventricle: The ventricular cavity size is within normal limits. There are no stigmata of prior infarction. There is no abnormal filling defect. Pulmonary arteries: Normal in size without  proximal filling defect. Pulmonary veins: 2 pulmonary veins on the right. The left pulmonary veins drain into a common trunk. Pericardium: Normal thickness with no significant effusion or calcium present. Cardiac valves: The aortic valve is trileaflet without significant calcification. The mitral valve is normal structure without significant calcification. Aorta: Normal caliber with no significant disease. Extra-cardiac findings: See attached radiology report for non-cardiac structures. IMPRESSION: 1. Coronary calcium score of 901. This was 92nd percentile for age and sex matched control. 2. Normal coronary origin with right dominance. 3. Mid LAD myocardial bridge. 4. Milld calcified plaque (25-49%) that is non-obstructive in the distal OM and mid RCA as described above. 5. The proximal LAD contains  a heavily calcified stenosis that is likely mild in severity (25-49%), but unable to exclude a moderate lesion. RECOMMENDATIONS: 1. Likely non-obstructive CAD that will just need risk factor management, but will send CT-FFR to exclude significant lesion in the proximal LAD. Lennie OdorWesley O'Neal, MD Electronically Signed   By: Lennie OdorWesley  O'Neal   On: 02/23/2019 14:45   Result Date: 02/23/2019 EXAM: OVER-READ INTERPRETATION  CT CHEST The following report is an over-read performed by radiologist Dr. Trudie Reedaniel Entrikin of [facility] on 02/23/2019. This over-read does not include interpretation of cardiac or coronary anatomy or pathology. The coronary calcium score/coronary CTA interpretation by the cardiologist is attached. COMPARISON:  None. FINDINGS: Within the visualized portions of the thorax there are no suspicious appearing pulmonary nodules or masses, there is no acute consolidative airspace disease, no pleural effusions, no pneumothorax and no lymphadenopathy. Visualized portions of the upper abdomen are unremarkable. There are no aggressive appearing lytic or blastic lesions noted in the visualized portions of the skeleton. IMPRESSION: No significant incidental noncardiac findings are noted. Electronically Signed: By: Trudie Reedaniel  Entrikin M.D. On: 02/23/2019 08:50   Ct Coronary Fractional Flow Reserve Fluid Analysis  Result Date: 02/25/2019 EXAM: CT FFR ANALYSIS CLINICAL DATA:  Chest pain FINDINGS: FFRct analysis was performed on the original cardiac CT angiogram dataset. Diagrammatic representation of the FFRct analysis is provided in a separate PDF document in PACS. This dictation was created using the PDF document and an interactive 3D model of the results. 3D model is not available in the EMR/PACS. Normal FFR range is >0.80. 1. Left Main: No significant stenosis. 2. LAD: FFR across pLAD lesion <0.50 concerning for significant stenosis. 3. LCX: 0.88; no significant stenosis. 4. RCA: 0.84; no significant  stenosis. IMPRESSION: 1. CT FFR analysis is concerning for significant proximal LAD stenosis. Cardiac catheterization is recommended. Lennie OdorWesley O'Neal, MD Electronically Signed   By: Lennie OdorWesley  O'Neal   On: 02/25/2019 16:28   Disposition   Pt is being discharged home today in good condition.  Follow-up Plans & Appointments   Do not stop asprin or plavix, this keeps stent open.  Otherwise you could have a heart attack.  Heart Healthy Diabetic diet   Call [facility] [address] at [phone]- [phone] if any bleeding, swelling or drainage at cath site.  May shower, no tub baths for 48 hours for groin sticks. No lifting over 5 pounds for 3 days.  No Driving for 3 days  No surgery for at least 6 months, please discuss with Dr. Elease HashimotoNahser  Take 1 NTG, under your tongue, while sitting.  If no relief of pain may repeat NTG, one tab every 5 minutes up to 3 tablets total over 15 minutes.  If no relief CALL 911.  If you have dizziness/lightheadness  while taking NTG, stop taking and call  911.          Follow-up Information    Nahser, Wonda Cheng, MD Follow up.   Specialty: Cardiology Why: the office should call you on Monday for follow up within next 2 weeks.  if you have not heard by Tuesday call the office.   Contact information: [address] [address] [phone]          Discharge Instructions    AMB Referral to Cardiac Rehabilitation - Phase II   Complete by: As directed    Diagnosis: Coronary Stents   After initial evaluation and assessments completed: Virtual Based Care may be provided alone or in conjunction with Phase 2 Cardiac Rehab based on patient barriers.: Yes      Discharge Medications   Allergies as of 03/03/2019      Reactions   Lisinopril Swelling   Lip Inflammation       Medication List    STOP taking these medications   aspirin 81 MG tablet Replaced by: aspirin 81 MG EC tablet   ibuprofen 200 MG tablet Commonly known as: ADVIL       TAKE these medications   acetaminophen 325 MG tablet Commonly known as: TYLENOL Take 2 tablets (650 mg total) by mouth every 4 (four) hours as needed for headache or mild pain.   aspirin 81 MG EC tablet Take 1 tablet (81 mg total) by mouth at bedtime. Replaces: aspirin 81 MG tablet   atorvastatin 20 MG tablet Commonly known as: LIPITOR Take 20 mg by mouth every evening.   clopidogrel 75 MG tablet Commonly known as: PLAVIX Take 1 tablet (75 mg total) by mouth daily with breakfast.   Fish Oil 1200 MG Caps Take 2,400 mg by mouth at bedtime.   isosorbide mononitrate 30 MG 24 hr tablet Commonly known as: IMDUR Take 1 tablet (30 mg total) by mouth daily.   losartan 50 MG tablet Commonly known as: COZAAR Take 50 mg by mouth every evening.   multivitamins ther. w/minerals Tabs tablet Take 1 tablet by mouth at bedtime.   nitroGLYCERIN 0.4 MG SL tablet Commonly known as: NITROSTAT Place 1 tablet (0.4 mg total) under the tongue every 5 (five) minutes as needed for chest pain.        Acute coronary syndrome (MI, NSTEMI, STEMI, etc) this admission?: No.    Outstanding Labs/Studies   Recheck lipids on follow up  Duration of Discharge Encounter   Greater than 30 minutes including physician time.  Signed, Cecilie Kicks, NP 03/03/2019, 8:19 AM

## 2019-03-03 NOTE — Discharge Instructions (Signed)
Do not stop asprin or plavix, this keeps stent open.  Otherwise you could have a heart attack.  Heart Healthy Diabetic diet   Call [facility] at [phone] if any bleeding, swelling or drainage at cath site.  May shower, no tub baths for 48 hours for groin sticks. No lifting over 5 pounds for 3 days.  No Driving for 3 days  No surgery for at least 6 months, please discuss with Dr. Acie Fredrickson  Take 1 NTG, under your tongue, while sitting.  If no relief of pain may repeat NTG, one tab every 5 minutes up to 3 tablets total over 15 minutes.  If no relief CALL 911.  If you have dizziness/lightheadness  while taking NTG, stop taking and call 911.

## 2019-03-03 NOTE — Progress Notes (Signed)
Progress Note  Patient Name: Jason Suarez Date of Encounter: 03/03/2019  Primary Cardiologist: Kristeen MissPhilip Nahser, MD  Subjective   No chest pain or shortness of breath.  No palpitations.  No pain at right radial access site.  Inpatient Medications    Scheduled Meds: . aspirin EC  81 mg Oral QHS  . atorvastatin  20 mg Oral q1800  . clopidogrel  75 mg Oral Q breakfast  . enoxaparin (LOVENOX) injection  40 mg Subcutaneous Q24H  . insulin aspart  0-15 Units Subcutaneous TID WC  . isosorbide mononitrate  30 mg Oral Daily  . losartan  50 mg Oral QHS  . sodium chloride flush  3 mL Intravenous Q12H  . sodium chloride flush  3 mL Intravenous Q12H   Continuous Infusions: . sodium chloride     PRN Meds: sodium chloride, acetaminophen, nitroGLYCERIN, ondansetron (ZOFRAN) IV, sodium chloride flush   Vital Signs    Vitals:   03/02/19 1333 03/02/19 2138 03/03/19 0350 03/03/19 0400  BP: (!) 142/72 (!) 144/72  138/71  Pulse: (!) 51 (!) 57  60  Resp: 19 20  19   Temp: 98 F (36.7 C) 98.7 F (37.1 C)  98.6 F (37 C)  TempSrc: Oral Oral  Oral  SpO2: 100% 100%  100%  Weight: 67.6 kg  67 kg   Height: 5\' 5"  (1.651 m)       Intake/Output Summary (Last 24 hours) at 03/03/2019 0748 Last data filed at 03/02/2019 2100 Gross per 24 hour  Intake 594.87 ml  Output 400 ml  Net 194.87 ml   Filed Weights   03/02/19 0902 03/02/19 1333 03/03/19 0350  Weight: 68 kg 67.6 kg 67 kg    Telemetry    Sinus rhythm.  Personally reviewed.  ECG    An ECG dated 03/03/2019 was personally reviewed today and demonstrated:  Sinus rhythm with incomplete right bundle branch block.  Physical Exam   GEN: No acute distress.   Neck: No JVD. Cardiac: RRR, no murmur, rub, or gallop.  Respiratory: Nonlabored. Clear to auscultation bilaterally. GI: Soft, nontender, bowel sounds present. MS: No edema; No deformity.  Right radial access site dressed, clean and intact. Neuro:  Nonfocal. Psych: Alert and  oriented x 3. Normal affect.  Labs    Chemistry Recent Labs  Lab 02/27/19 1234 03/03/19 0446  NA 142 138  K 4.9 3.7  CL 105 105  CO2 24 20*  GLUCOSE 106* 116*  BUN 10 10  CREATININE 0.80 0.73  CALCIUM 9.9 9.0  GFRNONAA 94 >60  GFRAA 109 >60  ANIONGAP  --  13     Hematology Recent Labs  Lab 02/27/19 1234 03/03/19 0446  WBC 7.4 8.9  RBC 4.20 4.04*  HGB 13.3 12.5*  HCT 38.9 38.0*  MCV 93 94.1  MCH 31.7 30.9  MCHC 34.2 32.9  RDW 12.7 12.8  PLT 203 169    Radiology    No results found.  Cardiac Studies   Cardiac catheterization 03/02/2019: Conclusions: 1. Severe single-vessel coronary artery disease with sequential 70% and 50% proximal/mid LAD stenoses, previously shown to be hemodynamically significant by CT FFR. 2. Moderate, non-obstructive coronary artery disease involving the LCx and RCA. 3. Mildly elevated LVEDP with normal LVEF. 4. Successful IVUS-guided orbital atherectomy and PCI to the proximal/mid LAD using Resolute Onyx 3.0 x 34 mm drug-eluting stent with 0% residual stenosis and TIMI-3 flow. 5. Small D2 lesion with 70% ostial stenosis was jailed by the stent with resultant loss of flow.  Occlusion was successfully treated with a 1.5 x 12 mm balloon, though a non-flow-limiting dissection occurred. The vessel is too small for stent placement.  Recommendations: 1. Overnight extended recovery. 2. Dual antiplatelet therapy with aspirin and clopidogrel for at least 6 months, ideally longer if tolerated. 3. Aggressive secondary prevention.  Patient Profile     63 y.o. male with a history of hypertension, hyperlipidemia, and family history of premature CAD.  He is status post recent cardiac CTA demonstrating hemodynamically significant proximal LAD stenosis.  Cardiac catheterization from September 18 resulted in atherectomy and DES intervention to the proximal LAD with jailing of the second diagonal which was treated with angioplasty.  Assessment & Plan     1.  Single-vessel obstructive CAD status post atherectomy with DES intervention to the proximal LAD, jailing of the second diagonal which was managed with balloon angioplasty (nonobstructing dissection noted).  He is clinically stable without angina at this time.  2.  Mixed hyperlipidemia, on Lipitor 20 mg daily (outpatient dose).  Last LDL 64 in December 2019.  3.  Essential hypertension, blood pressure adequately controlled at this time.  Plan for discharge home today.  Continue aspirin, Plavix, Lipitor, Imdur (may be able to stop later as an outpatient depending on symptoms), and Cozaar.  He will need to follow-up with Dr. Acie Fredrickson or APP in the next few weeks.  Consider cardiac rehabilitation as an outpatient.  Signed, Rozann Lesches, MD  03/03/2019, 7:48 AM

## 2019-03-03 NOTE — Plan of Care (Signed)
  Problem: Education: Goal: Understanding of CV disease, CV risk reduction, and recovery process will improve Outcome: Completed/Met Goal: Individualized Educational Video(s) Outcome: Completed/Met   Problem: Activity: Goal: Ability to return to baseline activity level will improve Outcome: Completed/Met   Problem: Health Behavior/Discharge Planning: Goal: Ability to safely manage health-related needs after discharge will improve Outcome: Completed/Met   Problem: Education: Goal: Knowledge of General Education information will improve Description: Including pain rating scale, medication(s)/side effects and non-pharmacologic comfort measures Outcome: Completed/Met   Problem: Health Behavior/Discharge Planning: Goal: Ability to manage health-related needs will improve Outcome: Completed/Met   Problem: Clinical Measurements: Goal: Ability to maintain clinical measurements within normal limits will improve Outcome: Completed/Met Goal: Will remain free from infection Outcome: Completed/Met Goal: Diagnostic test results will improve Outcome: Completed/Met Goal: Respiratory complications will improve Outcome: Completed/Met Goal: Cardiovascular complication will be avoided Outcome: Completed/Met   Problem: Activity: Goal: Risk for activity intolerance will decrease Outcome: Completed/Met   Problem: Nutrition: Goal: Adequate nutrition will be maintained Outcome: Completed/Met   Problem: Coping: Goal: Level of anxiety will decrease Outcome: Completed/Met   Problem: Elimination: Goal: Will not experience complications related to bowel motility Outcome: Completed/Met Goal: Will not experience complications related to urinary retention Outcome: Completed/Met   Problem: Pain Managment: Goal: General experience of comfort will improve Outcome: Completed/Met   Problem: Safety: Goal: Ability to remain free from injury will improve Outcome: Completed/Met   Problem: Skin  Integrity: Goal: Risk for impaired skin integrity will decrease Outcome: Completed/Met

## 2019-03-03 NOTE — Progress Notes (Signed)
Discharge instructions reviewed with patient.  These included, but were not limited to, the following:  Physiology of CV disease and prevention, post-cath instructions, dietary recommendations, medication teaching, prescriptions, initiation of emergency (including 911) protocol, when to call the MD, follow-up appointments, etc.   Comprehension of information confirmed via use of "teach-back" technique of education.  Patient escorted to exit via wheelchair accompanied by nurse where wife met him to be driven home. 

## 2019-03-03 NOTE — Telephone Encounter (Signed)
   Primary Cardiologist: Mertie Moores, MD  Chart reviewed as part of pre-operative protocol coverage. Patient was contacted 03/03/2019 in reference to pre-operative risk assessment for pending surgery as outlined below.  Jason Suarez was last seen on 03/03/19 by Dr. Domenic Polite at discharge from cardiac cath and stent placement.  Due to new cardiac stent, Jason Suarez will require asprin and plavix uninterrupted for at least 6 months.      Cecilie Kicks, NP 03/03/2019, 8:21 AM

## 2019-03-03 NOTE — Progress Notes (Signed)
CARDIAC REHAB PHASE I   PRE:  Rate/Rhythm: off monitor  BP:  Supine:   Sitting:140/94  Standing:    SaO2:   MODE:  Ambulation: 790 ft   POST:  Rate/Rhythm:   BP:  Supine:   Sitting: 135/83  Standing:    SaO2:  0930-1030 On arrival pt off monitor and RN going over discharge instructions. Assisted pt to ambulate in hall. He tolerated ambulation well without c/o of cp or SOB. VS stable Pt to side of bed after walk with call light in reach. Completed stent discharge education with pt. We discussed stent, risk factors, modifications, exercise guidelines, heart health diabetic diet, proper use of sl NGT, when to call MD and 911 and Outpt, CRP. He voices understanding. Will send referral to [facility]. CRP.  Rodney Langton RN 03/03/2019 10:25 AM

## 2019-03-05 ENCOUNTER — Encounter (HOSPITAL_COMMUNITY): Payer: Self-pay | Admitting: Internal Medicine

## 2019-03-05 MED FILL — Prasugrel HCl Tab 10 MG (Base Equiv): ORAL | Qty: 7 | Status: AC

## 2019-03-05 MED FILL — Heparin Sod (Porcine)-NaCl IV Soln 1000 Unit/500ML-0.9%: INTRAVENOUS | Qty: 500 | Status: AC

## 2019-03-06 ENCOUNTER — Telehealth (HOSPITAL_COMMUNITY): Payer: Self-pay

## 2019-03-06 NOTE — Telephone Encounter (Signed)
Contacted pt to see if he is interested in the cardiac rehab program pt stated that he is not interested in the program and didn't give a reason. I also told pt about his f/u appt on 03/08/2019 at 11:30am at [facility] on [address]. Pt understood.

## 2019-03-06 NOTE — Telephone Encounter (Signed)
Pt insurance is active and benefits verified through [employer] Co-pay 0, DED $200/$200 met, out of pocket $2,500/$282.34 met, co-insurance 20%. no pre-authorization required, REF# [id number]  Will contact patient to see if he is interested in the Cardiac Rehab Program. If interested, patient will need to complete follow up appt. Once completed, patient will be contacted for scheduling upon review by the RN Navigator.

## 2019-03-08 ENCOUNTER — Encounter: Payer: Self-pay | Admitting: Cardiology

## 2019-03-08 ENCOUNTER — Ambulatory Visit: Payer: BC Managed Care – PPO | Admitting: Cardiology

## 2019-03-08 ENCOUNTER — Other Ambulatory Visit: Payer: Self-pay

## 2019-03-08 VITALS — BP 112/60 | HR 62 | Ht 65.0 in | Wt 150.4 lb

## 2019-03-08 DIAGNOSIS — Z95820 Peripheral vascular angioplasty status with implants and grafts: Secondary | ICD-10-CM

## 2019-03-08 DIAGNOSIS — E785 Hyperlipidemia, unspecified: Secondary | ICD-10-CM | POA: Diagnosis not present

## 2019-03-08 DIAGNOSIS — I2510 Atherosclerotic heart disease of native coronary artery without angina pectoris: Secondary | ICD-10-CM | POA: Diagnosis not present

## 2019-03-08 DIAGNOSIS — I10 Essential (primary) hypertension: Secondary | ICD-10-CM | POA: Diagnosis not present

## 2019-03-08 NOTE — Patient Instructions (Addendum)
Medication Instructions:  STOP: Isosorbide   If you need a refill on your cardiac medications before your next appointment, please call your pharmacy.   Lab work: None   If you have labs (blood work) drawn today and your tests are completely normal, you will receive your results only by: Marland Kitchen MyChart Message (if you have MyChart) OR . A paper copy in the mail If you have any lab test that is abnormal or we need to change your treatment, we will call you to review the results.  Testing/Procedures: None   Follow-Up: You are scheduled to see Dr. Acie Fredrickson on 05/08/2019 @ 2:00 PM  Any Other Special Instructions Will Be Listed Below (If Applicable). None   Lifestyle Modifications to Prevent and Treat Heart Disease -Recommend heart healthy/Mediterranean diet, with whole grains, fruits, vegetables, fish, lean meats, nuts, olive oil and avocado oil.  -Limit salt intake to less than 2000 mg per day.  -Recommend moderate walking, starting slowly with a few minutes and working up to 3-5 times/week for 30-50 minutes each session. Aim for at least 150 minutes.week. Goal should be pace of 3 miles/hours, or walking 1.5 miles in 30 minutes -Recommend avoidance of tobacco products. Avoid excess alcohol. -Keep blood pressure well controlled, ideally less than 130/80.

## 2019-03-08 NOTE — Progress Notes (Signed)
Cardiology Office Note:    Date:  03/08/2019   ID:  Jason PartyNarender Suarez, DOB 02-Mar-1956, MRN [id number]  PCP:  Clayborn Heronankins, Jason R, MD  Cardiologist:  Kristeen MissPhilip Nahser, MD  Referring MD: Clayborn Heronankins, Jason R, MD   Chief Complaint  Patient presents with  . Hospitalization Follow-up  . Coronary Artery Disease    Post PCI    History of Present Illness:    Jason Suarez is a 63 y.o. male with a past medical history significant for hypertension, hyperlipidemia, chest pain and premature family history of CAD with brother at age 853.  The patient presented to the office 01/31/2019 with new chest pain described as tightness with associated diaphoresis, occurring with exertion and resolves with rest.  This was progressive over the prior 6 months.  Cardiac CTA was done revealing significant stenosis of the LAD with FFR.  Cardiac catheterization was arranged.  Left heart cath done on 03/02/2019 revealed significant single-vessel CAD with sequential 70% and 50% proximal/mid LAD stenosis.  This was treated with successful orbital atherectomy and PCI with DES.  There was a small D2 lesion with 70% ostial stenosis that was jailed by the stent with resulting loss of flow.  Occlusion was treated with PTCA, too small for stent placement.  He was planned for dual antiplatelet therapy for 6 months.  He was continued on atorvastatin and ARB..  It was noted that his Imdur may be able to be stopped as an outpatient.  The patient was planning de Quervain's surgery which was noted to need to be postponed for at least 6 months.  Jason Suarez is here today for hospital follow-up. He denies any chest pain or shortness of breath like prior to the stent. He is having a "not normal", "sour" feeling in his chest. Right wrist cath site is healed. Lives with his wife. He works in [employer], lifts more than 50#.  The patient wants to be out of work for a month post stent.  He is concerned about lifting.  His concern is mostly related to  issues with his wrist that limit his being able to lift items.  He is now doing walking slowly for 10 min in the morning and evening and working his way up, adding a minute every day or 2.  He is tolerating this well.  He reports that his diet was not bad to begin with but he and his wife have made positive changes.  He is eating lots of fruits and vegetables.  He reports compliance with all of his medications and is tolerating them well.   Cardiac studies   [facility]HC 03/02/19 Conclusions: 1. Severe single-vessel coronary artery disease with sequential 70% and 50% proximal/mid LAD stenoses, previously shown to be hemodynamically significant by CT FFR. 2. Moderate, non-obstructive coronary artery disease involving the LCx and RCA. 3. Mildly elevated LVEDP with normal LVEF. 4. Successful IVUS-guided orbital atherectomy and PCI to the proximal/mid LAD using Resolute Onyx 3.0 x 34 mm drug-eluting stent with 0% residual stenosis and TIMI-3 flow. 5. Small D2 lesion with 70% ostial stenosis was jailed by the stent with resultant loss of flow. Occlusion was successfully treated with a 1.5 x 12 mm balloon, though a non-flow-limiting dissection occurred. The vessel is too small for stent placement.  Recommendations: 1. Overnight extended recovery. 2. Dual antiplatelet therapy with aspirin and clopidogrel for at least 6 months, ideally longer if tolerated. 3. Aggressive secondary prevention.  Yvonne Kendallhristopher End, MD Diagnostic Dominance: Right  Intervention  Past Medical History:  Diagnosis Date  . Anxiety   . Back pain   . CAD in native artery 03/03/2019  . Chest pain   . Chest pressure 03/03/2012  . Dizziness   . DM (diabetes mellitus) (HCC)   . Hypercholesterolemia   . Hypertension   . Light headedness   . Low HDL (under 40)   . Overweight   . RBBB 03/03/2012  . S/P angioplasty with stent and atherectomy to proximal/mid LAD with Resolute Onyx,  and PTCA to small D2 that was jailed  03/03/2019  . Syncopal episodes     Past Surgical History:  Procedure Laterality Date  . CORONARY ATHERECTOMY N/A 03/02/2019   Procedure: CORONARY ATHERECTOMY;  Surgeon: Yvonne Kendall, MD;  Location: [facility];  Service: Cardiovascular;  Laterality: N/A;  . CORONARY STENT INTERVENTION N/A 03/02/2019   Procedure: CORONARY STENT INTERVENTION;  Surgeon: Yvonne Kendall, MD;  Location: [facility];  Service: Cardiovascular;  Laterality: N/A;  . INTRAVASCULAR ULTRASOUND/IVUS N/A 03/02/2019   Procedure: Intravascular Ultrasound/IVUS;  Surgeon: Yvonne Kendall, MD;  Location: [facility];  Service: Cardiovascular;  Laterality: N/A;  . KNEE ARTHROSCOPY     left  . LEFT HEART CATH AND CORONARY ANGIOGRAPHY N/A 03/02/2019   Procedure: LEFT HEART CATH AND CORONARY ANGIOGRAPHY;  Surgeon: Yvonne Kendall, MD;  Location: [facility];  Service: Cardiovascular;  Laterality: N/A;  . SHOULDER SURGERY     right   . SURGERY SCROTAL / TESTICULAR      Current Medications: Current Meds  Medication Sig  . acetaminophen (TYLENOL) 325 MG tablet Take 2 tablets (650 mg total) by mouth every 4 (four) hours as needed for headache or mild pain.  Marland Kitchen aspirin EC 81 MG EC tablet Take 1 tablet (81 mg total) by mouth at bedtime.  Marland Kitchen atorvastatin (LIPITOR) 20 MG tablet Take 20 mg by mouth every evening.   . clopidogrel (PLAVIX) 75 MG tablet Take 1 tablet (75 mg total) by mouth daily with breakfast.  . losartan (COZAAR) 50 MG tablet Take 50 mg by mouth every evening.   . Multiple Vitamins-Minerals (MULTIVITAMINS THER. W/MINERALS) TABS Take 1 tablet by mouth at bedtime.   . nitroGLYCERIN (NITROSTAT) 0.4 MG SL tablet Place 1 tablet (0.4 mg total) under the tongue every 5 (five) minutes as needed for chest pain.  . Omega-3 Fatty Acids (FISH OIL) 1200 MG CAPS Take 2,400 mg by mouth at bedtime.  . [DISCONTINUED] isosorbide mononitrate (IMDUR) 30 MG 24 hr tablet Take 1 tablet (30 mg total) by mouth  daily.     Allergies:   Lisinopril   Social History   Socioeconomic History  . Marital status: Married    Spouse name: Not on file  . Number of children: Not on file  . Years of education: Not on file  . Highest education level: Not on file  Occupational History  . Not on file  Social Needs  . Financial resource strain: Not on file  . Food insecurity    Worry: Not on file    Inability: Not on file  . Transportation needs    Medical: Not on file    Non-medical: Not on file  Tobacco Use  . Smoking status: Never Smoker  . Smokeless tobacco: Never Used  Substance and Sexual Activity  . Alcohol use: Yes  . Drug use: Never  . Sexual activity: Yes    Comment: MARRIED  Lifestyle  . Physical activity    Days  per week: Not on file    Minutes per session: Not on file  . Stress: Not on file  Relationships  . Social Musician on phone: Not on file    Gets together: Not on file    Attends religious service: Not on file    Active member of club or organization: Not on file    Attends meetings of clubs or organizations: Not on file    Relationship status: Not on file  Other Topics Concern  . Not on file  Social History Narrative  . Not on file     Family History: The patient's family history includes Coronary artery disease in his father; Diabetes in his mother; Heart attack in his father; Heart disease in his brother; Hypertension in his father and mother. ROS:   Please see the history of present illness.     All other systems reviewed and are negative.   EKG:  EKG is not ordered today.    Recent Labs: 03/03/2019: BUN 10; Creatinine, Ser 0.73; Hemoglobin 12.5; Platelets 169; Potassium 3.7; Sodium 138   Recent Lipid Panel No results found for: CHOL, TRIG, HDL, CHOLHDL, VLDL, LDLCALC, LDLDIRECT  Physical Exam:    VS:  BP 112/60   Pulse 62   Ht 5\' 5"  (1.651 m)   Wt 150 lb 6.4 oz (68.2 kg)   SpO2 97%   BMI 25.03 kg/m     Wt Readings from Last 6  Encounters:  03/08/19 150 lb 6.4 oz (68.2 kg)  03/03/19 147 lb 11.3 oz (67 kg)  01/31/19 151 lb 1.9 oz (68.5 kg)  03/03/12 156 lb 1.9 oz (70.8 kg)  10/06/09 160 lb (72.6 kg)     Physical Exam  Constitutional: He is oriented to person, place, and time. He appears well-developed and well-nourished. No distress.  HENT:  Head: Normocephalic and atraumatic.  Neck: Normal range of motion. Neck supple. No JVD present.  Cardiovascular: Normal rate, regular rhythm, normal heart sounds and intact distal pulses. Exam reveals no gallop and no friction rub.  No murmur heard. Pulmonary/Chest: Effort normal and breath sounds normal. No respiratory distress. He has no wheezes. He has no rales.  Abdominal: Soft. Bowel sounds are normal.  Musculoskeletal: Normal range of motion.        General: No edema.  Neurological: He is alert and oriented to person, place, and time.  Skin: Skin is warm and dry.  Psychiatric: He has a normal mood and affect. His behavior is normal. Judgment and thought content normal.  Vitals reviewed.    ASSESSMENT:    1. S/P angioplasty with stent   2. Coronary artery disease involving native coronary artery of native heart without angina pectoris   3. Essential (primary) hypertension   4. Hyperlipidemia, unspecified hyperlipidemia type    PLAN:    In order of problems listed above:  CAD, S/P PCI/DES -Recently treated for chest pain, cardiac cath showed severe stenosis of the LAD which was treated with atherectomy and drug-eluting stent.  Patient had jailed D2 that was treated with PTCA, too small for stent. -Patient denies chest pain or shortness of breath.  He has an odd feeling in his chest but nothing specific. -Patient continues on aspirin 81 mg, Plavix 75 mg, statin.  He is also on Imdur 30 mg-he is going to stop Imdur for trial.  Can resume if he develops chest discomfort. -Patient advised on cardiac rehab phase 2- pt does not want to attend.  The  patient reports  improved heart healthy diet.  He is working on increasing physical activity with walking. -Note provided for patient to return to work on October 19 per his request.  He has a limitation due to his wrist, but no limitations that I see related to cardiac issues.  Lifestyle Modifications to Prevent and Treat Heart Disease -Recommend heart healthy/Mediterranean diet, with whole grains, fruits, vegetables, fish, lean meats, nuts, olive oil and avocado oil.  -Limit salt intake to less than 2000 mg per day.  -Recommend moderate walking, starting slowly with a few minutes and working up to 3-5 times/week for 30-50 minutes each session. Aim for at least 150 minutes.week. Goal should be pace of 3 miles/hours, or walking 1.5 miles in 30 minutes -Recommend avoidance of tobacco products. Avoid excess alcohol. -Keep blood pressure well controlled, ideally less than 130/80.   Hypertension -On losartan 50 mg, Imdur -Blood pressure well controlled  Hyperlipidemia -On atorvastatin 20 mg daily, omega-3 fatty acids -Lipids followed by PCP. LDL was 53, 12/12/2018.    Medication Adjustments/Labs and Tests Ordered: Current medicines are reviewed at length with the patient today.  Concerns regarding medicines are outlined above. Labs and tests ordered and medication changes are outlined in the patient instructions below:  Patient Instructions  Medication Instructions:  STOP: Isosorbide   If you need a refill on your cardiac medications before your next appointment, please call your pharmacy.   Lab work: None   If you have labs (blood work) drawn today and your tests are completely normal, you will receive your results only by: Marland Kitchen MyChart Message (if you have MyChart) OR . A paper copy in the mail If you have any lab test that is abnormal or we need to change your treatment, we will call you to review the results.  Testing/Procedures: None   Follow-Up: You are scheduled to see Dr. Acie Fredrickson on 05/08/2019 @  2:00 PM  Any Other Special Instructions Will Be Listed Below (If Applicable). None   Lifestyle Modifications to Prevent and Treat Heart Disease -Recommend heart healthy/Mediterranean diet, with whole grains, fruits, vegetables, fish, lean meats, nuts, olive oil and avocado oil.  -Limit salt intake to less than 2000 mg per day.  -Recommend moderate walking, starting slowly with a few minutes and working up to 3-5 times/week for 30-50 minutes each session. Aim for at least 150 minutes.week. Goal should be pace of 3 miles/hours, or walking 1.5 miles in 30 minutes -Recommend avoidance of tobacco products. Avoid excess alcohol. -Keep blood pressure well controlled, ideally less than 130/80.      Signed, Daune Perch, NP  03/08/2019 2:09 PM    [facility]

## 2019-03-13 ENCOUNTER — Encounter (HOSPITAL_COMMUNITY): Payer: Self-pay | Admitting: Internal Medicine

## 2019-04-25 ENCOUNTER — Telehealth: Payer: Self-pay | Admitting: Cardiovascular Disease

## 2019-04-25 NOTE — Telephone Encounter (Signed)
Patient had recent procedure by Dr. Saunders Revel and wants to change providers.  Is this ok with you both ?

## 2019-04-25 NOTE — Telephone Encounter (Signed)
If he is willing to travel to [place], that is fine with me.  Nelva Bush, MD [facility] Pager: [phone]

## 2019-04-26 NOTE — Telephone Encounter (Signed)
Ok with me 

## 2019-04-27 NOTE — Telephone Encounter (Signed)
New Message:     I called pt at his phone number and left a message to call the office so we could schedule an appointment with Dr End in [place]. Pt is switching doctor and both doctors agreed to the switch.

## 2019-05-08 ENCOUNTER — Ambulatory Visit: Payer: BC Managed Care – PPO | Admitting: Cardiovascular Disease

## 2019-05-14 ENCOUNTER — Telehealth: Payer: Self-pay | Admitting: Internal Medicine

## 2019-05-14 NOTE — Telephone Encounter (Signed)
Received records request forms for physican to complete, placed in nurses box.

## 2019-05-14 NOTE — Telephone Encounter (Signed)
Placed in Dr End's office basket.  

## 2019-05-22 NOTE — Progress Notes (Signed)
Follow-up Outpatient Visit Date: 05/23/2019  Primary Care Provider: Clayborn Heron, MD [address] [place] [address]  Chief Complaint: Follow-up coronary artery disease  HPI:  Jason Suarez is a 63 y.o. male with history of coronary artery disease status post PCI to the LAD in 02/2019, hypertension, hyperlipidemia, and diabetes mellitus, who presents for follow-up of coronary artery disease.  He was previously followed in [facility] by Dr. Elease Hashimoto but has asked to transition care to our office here in [place].  He was last seen by Lizabeth Leyden, NP, in late September, few days after atherectomy and stent placement to the proximal and mid LAD.  He reported improvement in chest pain but intermittent "sour" feeling in his chest mononitrate was discontinued.  Today, Jason Suarez reports feeling well.  He reports occasional transient tightness in his chest when he gets anxious or upset.  However, he has not experienced any further exertional chest pain.  He has not needed to use sublingual nitroglycerin.  Previous headaches have also ceased since isosorbide mononitrate was discontinued.  He denies bleeding, remain on aspirin and clopidogrel.  He also denies shortness of breath, palpitations, lightheadedness, and edema.  Home blood pressures are usually <130/80.  --------------------------------------------------------------------------------------------------  Past Medical History:  Diagnosis Date  . Anxiety   . Back pain   . CAD in native artery 03/03/2019  . Chest pain   . Chest pressure 03/03/2012  . Dizziness   . DM (diabetes mellitus) (HCC)   . Hypercholesterolemia   . Hypertension   . Light headedness   . Low HDL (under 40)   . Overweight   . RBBB 03/03/2012  . S/P angioplasty with stent and atherectomy to proximal/mid LAD with Resolute Onyx,  and PTCA to small D2 that was jailed 03/03/2019  . Syncopal episodes    Past Surgical History:  Procedure Laterality Date  .  CORONARY ATHERECTOMY N/A 03/02/2019   Procedure: CORONARY ATHERECTOMY;  Surgeon: Yvonne Kendall, MD;  [facility];  Service: Cardiovascular;  Laterality: N/A;  . CORONARY STENT INTERVENTION N/A 03/02/2019   Procedure: CORONARY STENT INTERVENTION;  Surgeon: Yvonne Kendall, MD;  [facility];  Service: Cardiovascular;  Laterality: N/A;  . INTRAVASCULAR ULTRASOUND/IVUS N/A 03/02/2019   Procedure: Intravascular Ultrasound/IVUS;  Surgeon: Yvonne Kendall, MD;  [facility];  Service: Cardiovascular;  Laterality: N/A;  . KNEE ARTHROSCOPY     left  . LEFT HEART CATH AND CORONARY ANGIOGRAPHY N/A 03/02/2019   Procedure: LEFT HEART CATH AND CORONARY ANGIOGRAPHY;  Surgeon: Yvonne Kendall, MD;  [facility];  Service: Cardiovascular;  Laterality: N/A;  . SHOULDER SURGERY     right   . SURGERY SCROTAL / TESTICULAR      Allergies: Lisinopril  Social History   Tobacco Use  . Smoking status: Never Smoker  . Smokeless tobacco: Never Used  Substance Use Topics  . Alcohol use: Yes  . Drug use: Never    Family History  Problem Relation Age of Onset  . Heart attack Father        had stent   . Coronary artery disease Father   . Hypertension Father   . Diabetes Mother   . Hypertension Mother   . Heart disease Brother        CABG AT 56    Review of Systems: A 12-system review of systems was performed and was negative except as noted in the HPI.  --------------------------------------------------------------------------------------------------  Physical Exam: BP Marland Kitchen)  146/82 (BP Location: Right Arm, Patient Position: Sitting, Cuff Size: Normal)   Pulse (!) 57   Ht 5\' 5"  (1.651 m)   Wt 154 lb 8 oz (70.1 kg)   SpO2 98%   BMI 25.71 kg/m    Repeat BP: 132/80  General:  NAD. HEENT: No conjunctival pallor or scleral icterus. Facemask in place. Neck: Supple without lymphadenopathy, thyromegaly, JVD, or HJR. Lungs: Normal work  of breathing. Clear to auscultation bilaterally without wheezes or crackles. Heart: Regular rate and rhythm without murmurs, rubs, or gallops. Non-displaced PMI. Abd: Bowel sounds present. Soft, NT/ND without hepatosplenomegaly Ext: No lower extremity edema. Right radial arteriotomy is well-healed.  2+ radial pulses bilaterally. Skin: Warm and dry without rash.  EKG:  NSR with incomplete RBBB.  Compared with prior tracing from 02/24/2019, QRS has narrowed slightly.  Lab Results  Component Value Date   WBC 8.9 03/03/2019   HGB 12.5 (L) 03/03/2019   HCT 38.0 (L) 03/03/2019   MCV 94.1 03/03/2019   PLT 169 03/03/2019    Lab Results  Component Value Date   NA 138 03/03/2019   K 3.7 03/03/2019   CL 105 03/03/2019   CO2 20 (L) 03/03/2019   BUN 10 03/03/2019   CREATININE 0.73 03/03/2019   GLUCOSE 116 (H) 03/03/2019    No results found for: CHOL, HDL, LDLCALC, LDLDIRECT, TRIG, CHOLHDL  --------------------------------------------------------------------------------------------------  ASSESSMENT AND PLAN: Coronary artery disease: Jason Suarez is doing well without significant angina following PCI to the LAD in September.  He notes occasional transient chest discomfort with stress but no exertional symptoms.  We will plan to continue his current medications for secondary prevention, including at least 6 months of dual antiplatelet therapy with aspirin and clopidogrel.  Hypertension: Blood pressure mildly elevated today (goal less than 130/80).  Jason Suarez notes that his home blood pressures are typically lower than this.  We will therefore defer medication changes at this time.  Hyperlipidemia: Continue atorvastatin 20 mg daily.  Jason Suarez is due for annual physical with labs later this month with Dr. Radene Ou.  If LDL is above 70, further escalation of atorvastatin should be considered.  Type 2 diabetes mellitus: Diet controlled.  Ongoing management per Dr. Radene Ou.  Follow-up: Return to  clinic in 3 months.  Nelva Bush, MD 05/23/2019 9:11 AM

## 2019-05-23 ENCOUNTER — Ambulatory Visit (INDEPENDENT_AMBULATORY_CARE_PROVIDER_SITE_OTHER): Payer: BC Managed Care – PPO | Admitting: Internal Medicine

## 2019-05-23 ENCOUNTER — Encounter: Payer: Self-pay | Admitting: Internal Medicine

## 2019-05-23 ENCOUNTER — Encounter: Payer: Self-pay | Admitting: *Deleted

## 2019-05-23 ENCOUNTER — Other Ambulatory Visit: Payer: Self-pay

## 2019-05-23 VITALS — BP 146/82 | HR 57 | Ht 65.0 in | Wt 154.5 lb

## 2019-05-23 DIAGNOSIS — I10 Essential (primary) hypertension: Secondary | ICD-10-CM

## 2019-05-23 DIAGNOSIS — I2510 Atherosclerotic heart disease of native coronary artery without angina pectoris: Secondary | ICD-10-CM

## 2019-05-23 DIAGNOSIS — E1159 Type 2 diabetes mellitus with other circulatory complications: Secondary | ICD-10-CM | POA: Diagnosis not present

## 2019-05-23 DIAGNOSIS — E785 Hyperlipidemia, unspecified: Secondary | ICD-10-CM | POA: Insufficient documentation

## 2019-05-23 DIAGNOSIS — E119 Type 2 diabetes mellitus without complications: Secondary | ICD-10-CM | POA: Insufficient documentation

## 2019-05-23 NOTE — Telephone Encounter (Signed)
Send to [employer] via interoffice mail.  Faxed copy to [facility] to expedite completion .

## 2019-05-23 NOTE — Patient Instructions (Signed)
Medication Instructions:  Your physician recommends that you continue on your current medications as directed. Please refer to the Current Medication list given to you today.  *If you need a refill on your cardiac medications before your next appointment, please call your pharmacy*  Lab Work: NONE If you have labs (blood work) drawn today and your tests are completely normal, you will receive your results only by: Marland Kitchen MyChart Message (if you have MyChart) OR . A paper copy in the mail If you have any lab test that is abnormal or we need to change your treatment, we will call you to review the results.  Testing/Procedures: NONE  Follow-Up: At [facility], you and your health needs are our priority.  As part of our continuing mission to provide you with exceptional heart care, we have created designated Provider Care Teams.  These Care Teams include your primary Cardiologist (physician) and Advanced Practice Providers (APPs -  Physician Assistants and Nurse Practitioners) who all work together to provide you with the care you need, when you need it.  Your next appointment:   3 month(s)  The format for your next appointment:   In Person  Provider:    You may see DR Harrell Gave END or one of the following Advanced Practice Providers on your designated Care Team:    Murray Hodgkins, NP  Christell Faith, PA-C  Marrianne Mood, PA-C

## 2019-05-23 NOTE — Telephone Encounter (Signed)
Form completed by Dr End and given to Meriam Sprague for [place].

## 2019-08-13 ENCOUNTER — Ambulatory Visit: Payer: BC Managed Care – PPO | Attending: Internal Medicine

## 2019-08-13 DIAGNOSIS — Z23 Encounter for immunization: Secondary | ICD-10-CM | POA: Insufficient documentation

## 2019-08-13 NOTE — Progress Notes (Signed)
   Covid-19 Vaccination Clinic  Name:  Jason Suarez    MRN: [id number] DOB: 02/09/1956  08/13/2019  Jason Suarez was observed post Covid-19 immunization for 15 minutes without incidence. He was provided with Vaccine Information Sheet and instruction to access the V-Safe system.   Jason Suarez was instructed to call 911 with any severe reactions post vaccine: Marland Kitchen Difficulty breathing  . Swelling of your face and throat  . A fast heartbeat  . A bad rash all over your body  . Dizziness and weakness    Immunizations Administered    Name Date Dose VIS Date Route   Pfizer COVID-19 Vaccine 08/13/2019  8:20 AM 0.3 mL 05/25/2019 Intramuscular   Manufacturer: [employer], [employer]   Lot: [id number]   NDC: [address]-3

## 2019-08-22 ENCOUNTER — Other Ambulatory Visit: Payer: Self-pay

## 2019-08-22 ENCOUNTER — Encounter: Payer: Self-pay | Admitting: Internal Medicine

## 2019-08-22 ENCOUNTER — Ambulatory Visit (INDEPENDENT_AMBULATORY_CARE_PROVIDER_SITE_OTHER): Payer: BC Managed Care – PPO | Admitting: Internal Medicine

## 2019-08-22 ENCOUNTER — Encounter: Payer: Self-pay | Admitting: *Deleted

## 2019-08-22 VITALS — BP 128/70 | HR 47 | Ht 65.0 in | Wt 148.5 lb

## 2019-08-22 DIAGNOSIS — Z01810 Encounter for preprocedural cardiovascular examination: Secondary | ICD-10-CM | POA: Diagnosis not present

## 2019-08-22 DIAGNOSIS — I10 Essential (primary) hypertension: Secondary | ICD-10-CM | POA: Diagnosis not present

## 2019-08-22 DIAGNOSIS — E785 Hyperlipidemia, unspecified: Secondary | ICD-10-CM

## 2019-08-22 DIAGNOSIS — I2510 Atherosclerotic heart disease of native coronary artery without angina pectoris: Secondary | ICD-10-CM | POA: Diagnosis not present

## 2019-08-22 NOTE — Progress Notes (Signed)
Follow-up Outpatient Visit Date: 08/22/2019  Primary Care Provider: Aretta Nip, [place] [address]  Chief Complaint: Follow-up coronary artery disease  HPI:  Mr. Jason Suarez is a 64 y.o. male with history of coronary artery disease status post PCI to the LAD in 02/2019, hypertension, hyperlipidemia, and diabetes mellitus, who presents for follow-up of coronary artery disease.  I last saw him in 05/2019, at which time he was doing well other than occasional transient chest tightness associated with anxiety.  He denied exertional chest pain and had not needed to use sublingual nitroglycerin.  Today, Mr. Blanda reports that he continues to do well, denying chest pain, shortness of breath, palpitations, lightheadedness, and edema.  He is tolerating his current medications well, including DAPT with aspirin and clopidogrel.  He received his first COVID-19 vaccine last week.  He reports that he will likely need wrist surgery in the near future and inquires about the timing of this in regard to antiplatelet therapy.  He is exercising regularly, trying to walk at least 30-45 minutes, 5 days a week.  --------------------------------------------------------------------------------------------------  Past Medical History:  Diagnosis Date  . Anxiety   . Back pain   . CAD in native artery 03/03/2019  . Chest pain   . Chest pressure 03/03/2012  . Dizziness   . DM (diabetes mellitus) ([place])   . Hypercholesterolemia   . Hypertension   . Light headedness   . Low HDL (under 40)   . Overweight   . RBBB 03/03/2012  . S/P angioplasty with stent and atherectomy to proximal/mid LAD with Resolute Onyx,  and PTCA to small D2 that was jailed 03/03/2019  . Syncopal episodes    Past Surgical History:  Procedure Laterality Date  . CORONARY ATHERECTOMY N/A 03/02/2019   Procedure: CORONARY ATHERECTOMY;  Surgeon: Nelva Bush, MD;  Location: [facility];  Service:  Cardiovascular;  Laterality: N/A;  . CORONARY STENT INTERVENTION N/A 03/02/2019   Procedure: CORONARY STENT INTERVENTION;  Surgeon: Nelva Bush, MD;  Location: [facility];  Service: Cardiovascular;  Laterality: N/A;  . INTRAVASCULAR ULTRASOUND/IVUS N/A 03/02/2019   Procedure: Intravascular Ultrasound/IVUS;  Surgeon: Nelva Bush, MD;  Location: [facility];  Service: Cardiovascular;  Laterality: N/A;  . KNEE ARTHROSCOPY     left  . LEFT HEART CATH AND CORONARY ANGIOGRAPHY N/A 03/02/2019   Procedure: LEFT HEART CATH AND CORONARY ANGIOGRAPHY;  Surgeon: Nelva Bush, MD;  Location: [facility];  Service: Cardiovascular;  Laterality: N/A;  . SHOULDER SURGERY     right   . SURGERY SCROTAL / TESTICULAR      Current Meds  Medication Sig  . aspirin EC 81 MG EC tablet Take 1 tablet (81 mg total) by mouth at bedtime.  Marland Kitchen atorvastatin (LIPITOR) 20 MG tablet Take 20 mg by mouth every evening.   . clopidogrel (PLAVIX) 75 MG tablet Take 1 tablet (75 mg total) by mouth daily with breakfast.  . losartan (COZAAR) 50 MG tablet Take 50 mg by mouth every evening.   . Multiple Vitamins-Minerals (MULTIVITAMINS THER. W/MINERALS) TABS Take 1 tablet by mouth at bedtime.   . nitroGLYCERIN (NITROSTAT) 0.4 MG SL tablet Place 1 tablet (0.4 mg total) under the tongue every 5 (five) minutes as needed for chest pain.  . Omega-3 Fatty Acids (FISH OIL) 1200 MG CAPS Take 2,400 mg by mouth at bedtime.    Allergies: Lisinopril  Social History   Tobacco Use  . Smoking status: Never  Smoker  . Smokeless tobacco: Never Used  Substance Use Topics  . Alcohol use: Yes  . Drug use: Never    Family History  Problem Relation Age of Onset  . Heart attack Father        had stent   . Coronary artery disease Father   . Hypertension Father   . Diabetes Mother   . Hypertension Mother   . Heart disease Brother        CABG AT 56    Review of Systems: A 12-system review of systems was  performed and was negative except as noted in the HPI.  --------------------------------------------------------------------------------------------------  Physical Exam: BP 128/70 (BP Location: Left Arm, Patient Position: Sitting, Cuff Size: Normal)   Pulse (!) 47   Ht 5\' 5"  (1.651 m)   Wt 148 lb 8 oz (67.4 kg)   SpO2 98%   BMI 24.71 kg/m    HR with ambulation increased to 102 bpm.  General:  NAD. HEENT: No conjunctival pallor or scleral icterus. Facemask in place. Neck: No JVD or HJR. Lungs: Normal work of breathing. Clear to auscultation bilaterally without wheezes or crackles. Heart: Regular rate and rhythm without murmurs, rubs, or gallops. Non-displaced PMI. Abd: Bowel sounds present. Soft, NT/ND without hepatosplenomegaly Ext: No lower extremity edema. Radial, PT, and DP pulses are 2+ bilaterally. Skin: Warm and dry without rash.  EKG:  Sinus bradycardia with sinus arrhythmia.  Outside labs (05/28/2019): Lipid panel: Cholesterol 112, triglycerides 75, HDL 35, LDL 62  CMP: Na 140, K 5.0, Cl 103, CO2 28, BUN 12, creatinine 0.72, glucose 105, Ca 10.2, AST 19, ALT 17, alkaline phosphatase 68, total bilirubin 0.7, total protein 7.5, albumin 4.9  --------------------------------------------------------------------------------------------------  ASSESSMENT AND PLAN: Coronary artery disease: Mr. Witter continues to do well without angina or dyspnea, almost 6 months from his PCI to the LAD.  We have discussed the risks nad benefits of long-term DAPT and have agreed to continue with indefinite aspirin and clopidogrel.  However, after the Maylyn Narvaiz of this month, clopidogrel could be temporarily discontinued for elective procedures.  Hyperlipidemia: Recent outside lipid panel by D.r Rankins was notable for LDL of 62.  We will continue with atorvastatin 40 mg daily for target LDL < 70.  It is also reasonable to continue fish oil.  Hypertension: Blood pressure is well-controlled.  We will  continue losartan 50 mg daily.  Preop: Mr. Linch reports that he will likely need to undergo forearm/hand surgery in the near future.  He is without unstable cardiac symptoms and can proceed with elective surgery without additional testing or intervention.  Once he has completed 6 months of DAPT from his PCI to the LAD on 03/02/2019, clopidogrel could be discontinued for 5-7 days before the surgery and restarted when it is felt safe to do so from a postoperative standpoint.  Aspirin 81 mg daily should be continued during the perioperative period.  Follow-up: Return to clinic in 6 months.  03/04/2019, MD 08/22/2019 4:37 PM

## 2019-08-22 NOTE — Patient Instructions (Signed)
Medication Instructions:  Your physician recommends that you continue on your current medications as directed. Please refer to the Current Medication list given to you today.  *If you need a refill on your cardiac medications before your next appointment, please call your pharmacy*  Lab Work: none If you have labs (blood work) drawn today and your tests are completely normal, you will receive your results only by: . MyChart Message (if you have MyChart) OR . A paper copy in the mail If you have any lab test that is abnormal or we need to change your treatment, we will call you to review the results.  Testing/Procedures: none  Follow-Up: At [facility], you and your health needs are our priority.  As part of our continuing mission to provide you with exceptional heart care, we have created designated Provider Care Teams.  These Care Teams include your primary Cardiologist (physician) and Advanced Practice Providers (APPs -  Physician Assistants and Nurse Practitioners) who all work together to provide you with the care you need, when you need it.  We recommend signing up for the patient portal called "MyChart".  Sign up information is provided on this After Visit Summary.  MyChart is used to connect with patients for Virtual Visits (Telemedicine).  Patients are able to view lab/test results, encounter notes, upcoming appointments, etc.  Non-urgent messages can be sent to your provider as well.   To learn more about what you can do with MyChart, go to [email or web address].    Your next appointment:   6 month(s)  The format for your next appointment:   In Person  Provider:    You may see DR CHRISTOPHER END or one of the following Advanced Practice Providers on your designated Care Team:    Christopher Berge, NP  Ryan Dunn, PA-C  Jacquelyn Visser, PA-C  

## 2019-09-11 ENCOUNTER — Ambulatory Visit: Payer: BC Managed Care – PPO | Attending: Internal Medicine

## 2019-09-11 DIAGNOSIS — Z23 Encounter for immunization: Secondary | ICD-10-CM

## 2019-09-11 NOTE — Progress Notes (Signed)
   Covid-19 Vaccination Clinic  Name:  Jason Suarez    MRN: [id number] DOB: 02/22/1956  09/11/2019  Jason Suarez was observed post Covid-19 immunization for 15 minutes without incident. He was provided with Vaccine Information Sheet and instruction to access the V-Safe system.   Jason Suarez was instructed to call 911 with any severe reactions post vaccine: Marland Kitchen Difficulty breathing  . Swelling of face and throat  . A fast heartbeat  . A bad rash all over body  . Dizziness and weakness   Immunizations Administered    Name Date Dose VIS Date Route   Pfizer COVID-19 Vaccine 09/11/2019  8:25 AM 0.3 mL 05/25/2019 Intramuscular   Manufacturer: [employer], [employer]   Lot: [id number]   NDC: [address]-5

## 2020-01-26 ENCOUNTER — Other Ambulatory Visit: Payer: Self-pay | Admitting: Cardiology

## 2020-02-27 ENCOUNTER — Other Ambulatory Visit: Payer: Self-pay

## 2020-02-27 ENCOUNTER — Ambulatory Visit: Payer: BC Managed Care – PPO | Admitting: Internal Medicine

## 2020-02-27 ENCOUNTER — Encounter: Payer: Self-pay | Admitting: Internal Medicine

## 2020-02-27 VITALS — BP 144/72 | HR 58 | Ht 65.0 in | Wt 142.0 lb

## 2020-02-27 DIAGNOSIS — I2510 Atherosclerotic heart disease of native coronary artery without angina pectoris: Secondary | ICD-10-CM | POA: Diagnosis not present

## 2020-02-27 DIAGNOSIS — E785 Hyperlipidemia, unspecified: Secondary | ICD-10-CM

## 2020-02-27 DIAGNOSIS — I10 Essential (primary) hypertension: Secondary | ICD-10-CM

## 2020-02-27 MED ORDER — LOSARTAN POTASSIUM 100 MG PO TABS: 100.0000 mg | ORAL_TABLET | Freq: Every day | ORAL | 1 refills | Status: DC

## 2020-02-27 NOTE — Progress Notes (Signed)
Follow-up Outpatient Visit Date: 02/27/2020  Primary Care Provider: Clayborn Heron, MD [address] [place] [address]  Chief Complaint: Follow-up CAD  HPI:  Jason Suarez is a 64 y.o. male with history of coronary artery disease status post PCI to the LAD in 02/2019, hypertension, hyperlipidemia, and diabetes mellitus, who presents for follow-up of CAD.  I last saw him in March, at which time he was doing well.  Today, Jason Suarez reports that he still feels well.  He is walking most days of the week for 45 minutes at a time without any difficulty.  He sometimes notices transient chest wall soreness when he bends over but no exertional chest pain.  He also denies shortness of breath, palpitations, lightheadedness, and edema.  He is tolerating his medications well.  --------------------------------------------------------------------------------------------------  Past Medical History:  Diagnosis Date  . Anxiety   . Back pain   . CAD in native artery 03/03/2019  . Chest pain   . Chest pressure 03/03/2012  . Dizziness   . DM (diabetes mellitus) (HCC)   . Hypercholesterolemia   . Hypertension   . Light headedness   . Low HDL (under 40)   . Overweight   . RBBB 03/03/2012  . S/P angioplasty with stent and atherectomy to proximal/mid LAD with Resolute Onyx,  and PTCA to small D2 that was jailed 03/03/2019  . Syncopal episodes    Past Surgical History:  Procedure Laterality Date  . CORONARY ATHERECTOMY N/A 03/02/2019   Procedure: CORONARY ATHERECTOMY;  Surgeon: Yvonne Kendall, MD;  [facility];  Service: Cardiovascular;  Laterality: N/A;  . CORONARY STENT INTERVENTION N/A 03/02/2019   Procedure: CORONARY STENT INTERVENTION;  Surgeon: Yvonne Kendall, MD;  [facility];  Service: Cardiovascular;  Laterality: N/A;  . INTRAVASCULAR ULTRASOUND/IVUS N/A 03/02/2019   Procedure: Intravascular Ultrasound/IVUS;  Surgeon: Yvonne Kendall, MD;  [facility];  Service: Cardiovascular;  Laterality: N/A;  . KNEE ARTHROSCOPY     left  . LEFT HEART CATH AND CORONARY ANGIOGRAPHY N/A 03/02/2019   Procedure: LEFT HEART CATH AND CORONARY ANGIOGRAPHY;  Surgeon: Yvonne Kendall, MD;  [facility];  Service: Cardiovascular;  Laterality: N/A;  . SHOULDER SURGERY     right   . SURGERY SCROTAL / TESTICULAR      Current Meds  Medication Sig  . atorvastatin (LIPITOR) 20 MG tablet Take 20 mg by mouth every evening.   . clopidogrel (PLAVIX) 75 MG tablet TAKE 1 TABLET (75 MG TOTAL) BY MOUTH DAILY WITH BREAKFAST.  . Multiple Vitamins-Minerals (MULTIVITAMINS THER. W/MINERALS) TABS Take 1 tablet by mouth at bedtime.   . nitroGLYCERIN (NITROSTAT) 0.4 MG SL tablet Place 1 tablet (0.4 mg total) under the tongue every 5 (five) minutes as needed for chest pain.  . Omega-3 Fatty Acids (FISH OIL) 1200 MG CAPS Take 2,400 mg by mouth at bedtime.  . [DISCONTINUED] aspirin EC 81 MG EC tablet Take 1 tablet (81 mg total) by mouth at bedtime.  . [DISCONTINUED] losartan (COZAAR) 50 MG tablet Take 50 mg by mouth every evening.     Allergies: Lisinopril  Social History   Tobacco Use  . Smoking status: Never Smoker  . Smokeless tobacco: Never Used  Substance Use Topics  . Alcohol use: Yes  . Drug use: Never    Family History  Problem Relation Age of Onset  . Heart attack Father        had stent   .  Coronary artery disease Father   . Hypertension Father   . Diabetes Mother   . Hypertension Mother   . Heart disease Brother        CABG AT 56    Review of Systems: A 12-system review of systems was performed and was negative except as noted in the HPI.  --------------------------------------------------------------------------------------------------  Physical Exam: BP (!) 144/72 (BP Location: Left Arm, Patient Position: Sitting, Cuff Size: Normal)   Pulse (!) 58   Ht 5\' 5"  (1.651 m)   Wt 142 lb (64.4 kg)   BMI 23.63 kg/m      General: NAD. Neck: No JVD or HJR. Lungs: CTA bilaterally. Heart: RRR w/o m/r/g Abd: Soft, NT/ND. Ext: No LE edema.  2+ radial and pedal pulses.  EKG:  Sinus bradycardia with borderline RBBB.  HR increased from prior tracing on 08/22/2019.  Otherwise, no significant change.  --------------------------------------------------------------------------------------------------  ASSESSMENT AND PLAN: Coronary artery disease: Mr. Statzer continues to do well following PCI to LAD a year ago.  As he is now 12 months out from PCI, we have agreed to discontinue aspirin and continue indefinite clopidogrel 75 mg daily.  His transient chest discomfort when he bends over is most likely musculoskeletal in nature.  Hyperlipidemia: Labs followed by his PCP.  We will continue atorvastatin 20 mg daily with goal LDL < 70.  Hypertension: Blood pressure mildly elevated today (goal < 130/70).  Home BP's are typically similar to today's reading.  We will plan to increase losartan to 100 mg daily, with follow-up BMP in 1-2 weeks.  Follow-up: Return to clinic in 6 months.  Betti Cruz, MD 02/28/2020 1:43 PM

## 2020-02-27 NOTE — Patient Instructions (Signed)
Medication Instructions:  Your physician has recommended you make the following change in your medication:  1- STOP Aspirin. 2- INCREASE Losartan to 100 mg by mouth once a day.  *If you need a refill on your cardiac medications before your next appointment, please call your pharmacy*   Lab Work: Your physician recommends that you return for lab work in: 1-2 weeks for [employer] [facility] [employer].   If you have labs (blood work) drawn today and your tests are completely normal, you will receive your results only by:  MyChart Message (if you have MyChart) OR  A paper copy in the mail If you have any lab test that is abnormal or we need to change your treatment, we will call you to review the results.   Testing/Procedures: none  Follow-Up: At [facility], you and your health needs are our priority.  As part of our continuing mission to provide you with exceptional heart care, we have created designated Provider Care Teams.  These Care Teams include your primary Cardiologist (physician) and Advanced Practice Providers (APPs -  Physician Assistants and Nurse Practitioners) who all work together to provide you with the care you need, when you need it.  We recommend signing up for the patient portal called "MyChart".  Sign up information is provided on this After Visit Summary.  MyChart is used to connect with patients for Virtual Visits (Telemedicine).  Patients are able to view lab/test results, encounter notes, upcoming appointments, etc.  Non-urgent messages can be sent to your provider as well.   To learn more about what you can do with MyChart, go to [email or web address].    Your next appointment:   6 month(s)  The format for your next appointment:   In Person  Provider:    You may see DR Cristal Deer END or one of the following Advanced Practice Providers on your designated Care Team:    Nicolasa Ducking, NP  Eula Listen, PA-C  Marisue Ivan, PA-C

## 2020-02-28 ENCOUNTER — Encounter: Payer: Self-pay | Admitting: Internal Medicine

## 2020-03-10 ENCOUNTER — Other Ambulatory Visit: Payer: BC Managed Care – PPO

## 2020-03-11 ENCOUNTER — Telehealth: Payer: Self-pay | Admitting: Internal Medicine

## 2020-03-11 NOTE — Telephone Encounter (Signed)
Patient called back to speak with nurse. Nurse was unable to talk but patient was instructed to read mychart message and call back with further questions

## 2020-03-11 NOTE — Telephone Encounter (Signed)
Patient asks is he ok to take ibuprofen with his blood thinner. Please call to advise.

## 2020-03-11 NOTE — Telephone Encounter (Signed)
No answer. Mailbox is full and cannot accept messages at this time.  Patient uses MyChart. MyChart message sent to patient saying not recommended to use Ibuprofen or NSAIDS while on Plavix.

## 2020-03-17 ENCOUNTER — Other Ambulatory Visit: Payer: Self-pay | Admitting: *Deleted

## 2020-03-17 ENCOUNTER — Other Ambulatory Visit: Payer: Self-pay

## 2020-03-17 ENCOUNTER — Other Ambulatory Visit: Payer: BC Managed Care – PPO | Admitting: *Deleted

## 2020-03-17 DIAGNOSIS — I2510 Atherosclerotic heart disease of native coronary artery without angina pectoris: Secondary | ICD-10-CM

## 2020-03-17 DIAGNOSIS — I10 Essential (primary) hypertension: Secondary | ICD-10-CM

## 2020-03-18 LAB — BASIC METABOLIC PANEL
BUN/Creatinine Ratio: 14 (ref 10–24)
BUN: 11 mg/dL (ref 8–27)
CO2: 25 mmol/L (ref 20–29)
Calcium: 9.7 mg/dL (ref 8.6–10.2)
Chloride: 103 mmol/L (ref 96–106)
Creatinine, Ser: 0.78 mg/dL (ref 0.76–1.27)
GFR calc Af Amer: 109 mL/min/{1.73_m2} (ref >59)
GFR calc non Af Amer: 94 mL/min/{1.73_m2} (ref >59)
Glucose: 81 mg/dL (ref 65–99)
Potassium: 3.8 mmol/L (ref 3.5–5.2)
Sodium: 141 mmol/L (ref 134–144)

## 2020-07-09 ENCOUNTER — Other Ambulatory Visit: Payer: Self-pay | Admitting: Cardiovascular Disease

## 2020-07-09 IMAGING — CT CT HEART MORP W/ CTA COR W/ SCORE W/ CA W/CM &/OR W/O CM
4 of 7 series · 8 of 20 positions shown, 9 images · IV contrast (APPLIED)
Comparison: None.
COMPARISON: None.

Addendum:
EXAM:
OVER-READ INTERPRETATION  CT CHEST

The following report is an over-read performed by radiologist Dr.
Pompidou Pedro [REDACTED] on 02/23/2019. This
over-read does not include interpretation of cardiac or coronary
anatomy or pathology. The coronary calcium score/coronary CTA
interpretation by the cardiologist is attached.
CLINICAL DATA: Chest pain
Cardiac/Coronary CTA
TECHNIQUE: The patient was scanned on a Phillips Force scanner. A 100 kV
prospective scan was triggered in the descending thoracic aorta at
111 HU's. Axial non-contrast 3 mm slices were carried out through
the heart. The data set was analyzed on a dedicated work station and
scored using the Agatson method. Gantry rotation speed was 250 msecs
and collimation was .6 mm. No beta blockade and 0.8 mg of sl NTG was
given. The 3D data set was reconstructed in 5% intervals of the
35-75 % of the R-R cycle. Diastolic phases were analyzed on a
dedicated work station using MPR, MIP and VRT modes. The patient
received 80 cc of contrast.

[Series 6: best diast 75 % · axial · 0.39mm/px · z∈[-152,-111]mm · 2 of 311 slices shown, 3 images]
[im 104/311  vessel]
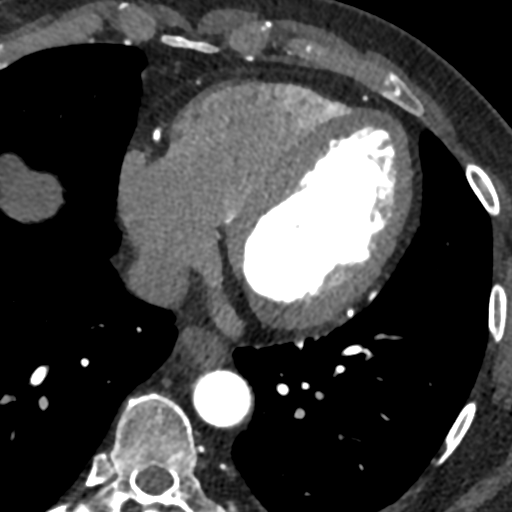
[im 104/311  lung]
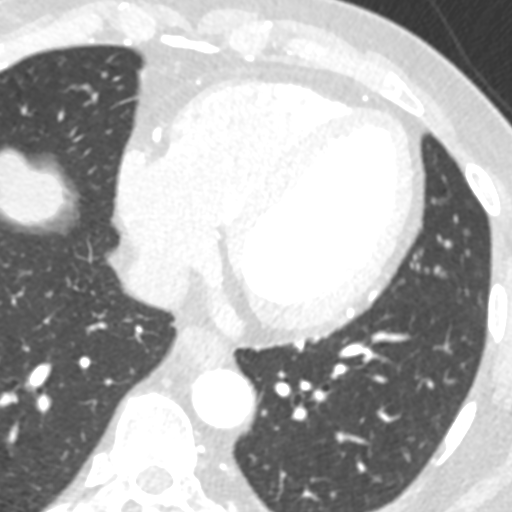
[im 207/311  vessel]
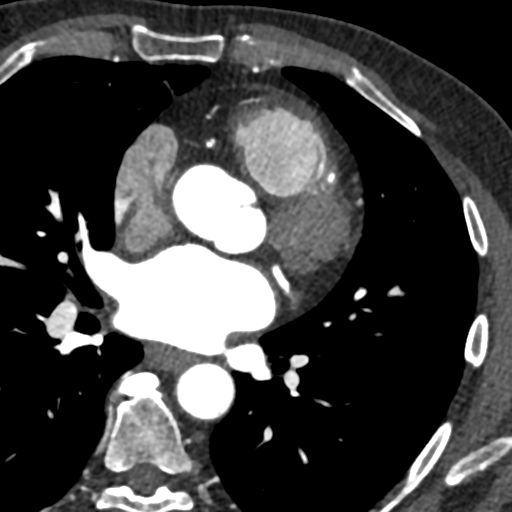

[Series 7: best syst 75 % · axial · 0.39mm/px · z∈[-152,-111]mm · 2 of 311 slices shown]
[im 104/311  vessel]
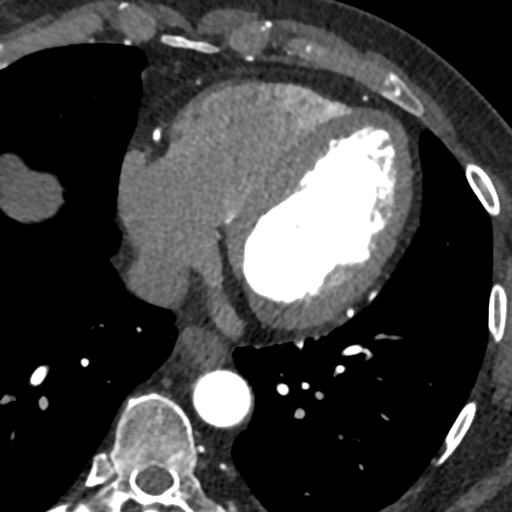
[im 207/311  vessel]
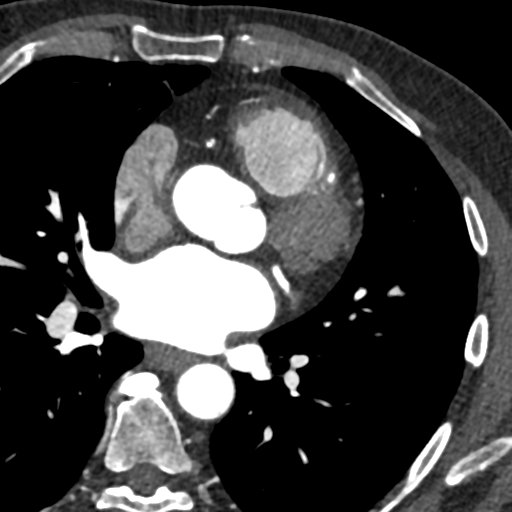

[Series 8: ts diast sharp 75 % · axial · 0.39mm/px · z∈[-152,-111]mm · 2 of 311 slices shown]
[im 104/311  lung]
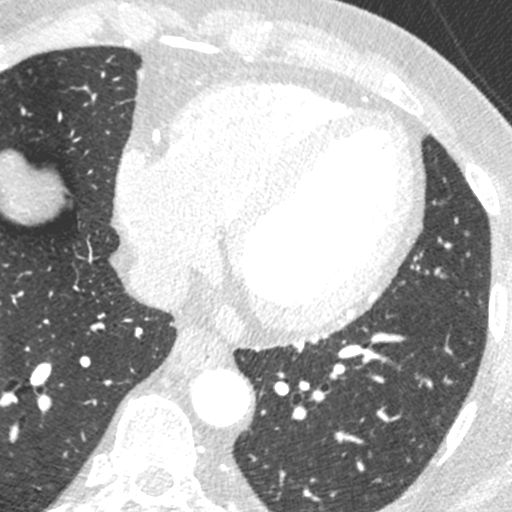
[im 207/311  lung]
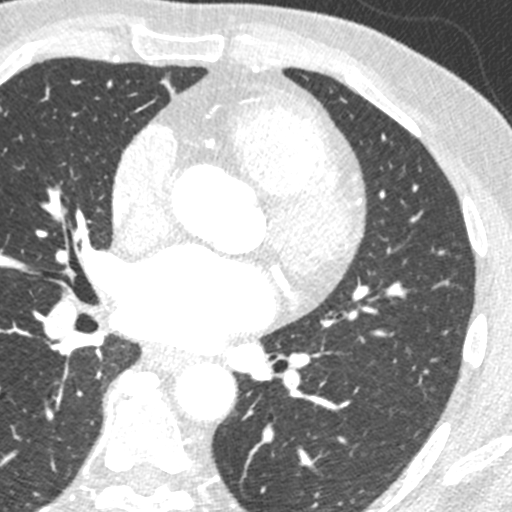

[Series 9: ts syst sharp 75 % · axial · 0.39mm/px · z∈[-152,-111]mm · 2 of 311 slices shown]
[im 104/311  lung]
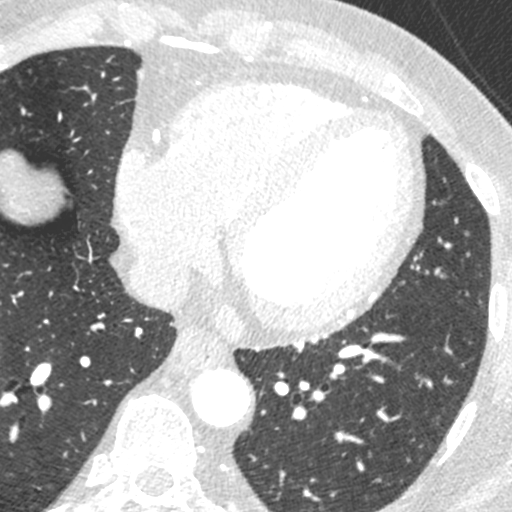
[im 207/311  lung]
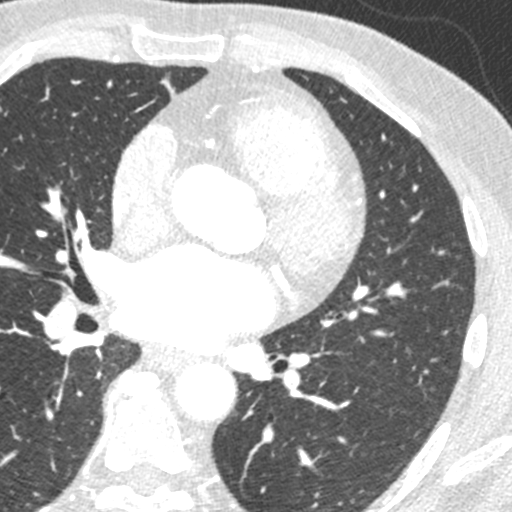

[8 of 20 positions shown; findings below may reference images not displayed]

FINDINGS: Within the visualized portions of the thorax there are no suspicious
appearing pulmonary nodules or masses, there is no acute
consolidative airspace disease, no pleural effusions, no
pneumothorax and no lymphadenopathy. Visualized portions of the
upper abdomen are unremarkable. There are no aggressive appearing
lytic or blastic lesions noted in the visualized portions of the
skeleton.
IMPRESSION: No significant incidental noncardiac findings are noted.
FINDINGS: Image quality: excellent.

Noise artifact is: Limited.

Coronary Arteries:  Normal coronary origin.  Right dominance.

Left main: Short without plaque or stenosis. The LAD and LCX almost
appear to have separate ostia.

Left anterior descending artery: The proximal LAD contains heavy
calcification that is circumferential and appears to be mild in
severity (25-49%), but it is difficult to exclude a moderate
stenosis (50-69%). There is a small mid myocardial bridge. The mLAD
and dLAD are patent without plaque or stenosis. The LAD gives off 2
patent diagonal branches.

Left circumflex artery: The LCX is non-dominant and gives off 1
large OM system. The proximal LCX contains minimal calcified plaque
(<25%). The mid segment of the large OM system contains minimal
calcified plaque (<25%). The distal OM system contains mild
calcified plaque (25-49%).

Right coronary artery: The RCA is dominant with normal take off from
the right coronary cusp. The proximal segment contains minimal
calcified plaque (<25%). The mid RCA contains mild calcified plaque
(25-49%). The distal RCA contains minimal calcified plaque (<25%).
The RCA terminates as a PDA and right posterolateral branch without
evidence of plaque or stenosis.

Right Atrium: Right atrial size is within normal limits.

Right Ventricle: The right ventricular cavity is within normal
limits.

Left Atrium: Left atrial size is normal in size with no left atrial
appendage filling defect. The STARKE appears lipomatous.

Left Ventricle: The ventricular cavity size is within normal limits.
There are no stigmata of prior infarction. There is no abnormal
filling defect.

Pulmonary arteries: Normal in size without proximal filling defect.

Pulmonary veins: 2 pulmonary veins on the right. The left pulmonary
veins drain into a common trunk.

Pericardium: Normal thickness with no significant effusion or
calcium present.

Cardiac valves: The aortic valve is trileaflet without significant
calcification. The mitral valve is normal structure without
significant calcification.

Aorta: Normal caliber with no significant disease.

Extra-cardiac findings: See attached radiology report for
non-cardiac structures.
IMPRESSION: 1. Coronary calcium score of 901. This was 92nd percentile for age
and sex matched control.

2. Normal coronary origin with right dominance.

3. Mid LAD myocardial bridge.

4. Milld calcified plaque (25-49%) that is non-obstructive in the
distal OM and mid RCA as described above.

5. The proximal LAD contains a heavily calcified stenosis that is
likely mild in severity (25-49%), but unable to exclude a moderate
lesion.

RECOMMENDATIONS:
1. Likely non-obstructive CAD that will just need risk factor
management, but will send CT-FFR to exclude significant lesion in
the proximal LAD.

*** End of Addendum ***
EXAM:
OVER-READ INTERPRETATION  CT CHEST

The following report is an over-read performed by radiologist Dr.
Pompidou Pedro [REDACTED] on 02/23/2019. This
over-read does not include interpretation of cardiac or coronary
anatomy or pathology. The coronary calcium score/coronary CTA
interpretation by the cardiologist is attached.
FINDINGS: Within the visualized portions of the thorax there are no suspicious
appearing pulmonary nodules or masses, there is no acute
consolidative airspace disease, no pleural effusions, no
pneumothorax and no lymphadenopathy. Visualized portions of the
upper abdomen are unremarkable. There are no aggressive appearing
lytic or blastic lesions noted in the visualized portions of the
skeleton.
IMPRESSION: No significant incidental noncardiac findings are noted.

## 2020-08-06 ENCOUNTER — Other Ambulatory Visit: Payer: Self-pay

## 2020-08-06 ENCOUNTER — Ambulatory Visit: Payer: BC Managed Care – PPO | Admitting: Internal Medicine

## 2020-08-06 ENCOUNTER — Other Ambulatory Visit: Payer: Self-pay | Admitting: Cardiology

## 2020-08-06 ENCOUNTER — Encounter: Payer: Self-pay | Admitting: Internal Medicine

## 2020-08-06 VITALS — BP 120/80 | HR 61 | Ht 65.0 in | Wt 144.0 lb

## 2020-08-06 DIAGNOSIS — E1169 Type 2 diabetes mellitus with other specified complication: Secondary | ICD-10-CM | POA: Diagnosis not present

## 2020-08-06 DIAGNOSIS — I2510 Atherosclerotic heart disease of native coronary artery without angina pectoris: Secondary | ICD-10-CM | POA: Diagnosis not present

## 2020-08-06 DIAGNOSIS — I10 Essential (primary) hypertension: Secondary | ICD-10-CM | POA: Diagnosis not present

## 2020-08-06 DIAGNOSIS — E785 Hyperlipidemia, unspecified: Secondary | ICD-10-CM

## 2020-08-06 NOTE — Progress Notes (Signed)
Follow-up Outpatient Visit Date: 08/06/2020  Primary Care Provider: Clayborn Heron, MD [address] [place] [address]  Chief Complaint: Follow-up coronary artery disease  HPI:  Jason Suarez is a 65 y.o. male with history of coronary artery disease status post PCI to the LAD in 02/2019, hypertension, hyperlipidemia,and diabetes mellitus, who presents for follow-up of coronary artery disease.  I last saw him in mid-September, at which time he was doing well.  He reported transient soreness in the chest when bending over but otherwise no chest pain.  He was walking 45 minutes a day most days without any difficulty.  Due to suboptimal blood pressure control, we agreed to increase losartan to 100 mg daily.  Aspirin was also discontinued in favor of indefinite clopidogrel.  Today, Mr. Wacha reports that he is feeling well.  He denies chest pain, shortness of breath, palpitations, lightheadedness, edema, and bleeding.  He notes that he is not exercising as much now that it is winter.  He hopes to increase his activity.  He is planning to travel to [place] next month and will be staying there for about 5 months.  --------------------------------------------------------------------------------------------------  Past Medical History:  Diagnosis Date  . Anxiety   . Back pain   . CAD in native artery 03/03/2019  . Chest pain   . Chest pressure 03/03/2012  . Dizziness   . DM (diabetes mellitus) (HCC)   . Hypercholesterolemia   . Hypertension   . Light headedness   . Low HDL (under 40)   . Overweight   . RBBB 03/03/2012  . S/P angioplasty with stent and atherectomy to proximal/mid LAD with Resolute Onyx,  and PTCA to small D2 that was jailed 03/03/2019  . Syncopal episodes    Past Surgical History:  Procedure Laterality Date  . CORONARY ATHERECTOMY N/A 03/02/2019   Procedure: CORONARY ATHERECTOMY;  Surgeon: Yvonne Kendall, MD;  [facility];  Service: Cardiovascular;   Laterality: N/A;  . CORONARY STENT INTERVENTION N/A 03/02/2019   Procedure: CORONARY STENT INTERVENTION;  Surgeon: Yvonne Kendall, MD;  Location: [facility];  Service: Cardiovascular;  Laterality: N/A;  . INTRAVASCULAR ULTRASOUND/IVUS N/A 03/02/2019   Procedure: Intravascular Ultrasound/IVUS;  Surgeon: Yvonne Kendall, MD;  [facility];  Service: Cardiovascular;  Laterality: N/A;  . KNEE ARTHROSCOPY     left  . LEFT HEART CATH AND CORONARY ANGIOGRAPHY N/A 03/02/2019   Procedure: LEFT HEART CATH AND CORONARY ANGIOGRAPHY;  Surgeon: Yvonne Kendall, MD;  [facility];  Service: Cardiovascular;  Laterality: N/A;  . SHOULDER SURGERY     right   . SURGERY SCROTAL / TESTICULAR      Current Meds  Medication Sig  . atorvastatin (LIPITOR) 20 MG tablet Take 20 mg by mouth every evening.   . clopidogrel (PLAVIX) 75 MG tablet TAKE 1 TABLET (75 MG TOTAL) BY MOUTH DAILY WITH BREAKFAST.  Marland Kitchen losartan (COZAAR) 100 MG tablet Take 1 tablet (100 mg total) by mouth daily.  . Multiple Vitamins-Minerals (MULTIVITAMINS THER. W/MINERALS) TABS Take 1 tablet by mouth at bedtime.   . nitroGLYCERIN (NITROSTAT) 0.4 MG SL tablet PLACE 1 TABLET (0.4 MG TOTAL) UNDER THE TONGUE EVERY 5 (FIVE) MINUTES AS NEEDED FOR CHEST PAIN.  Marland Kitchen Omega-3 Fatty Acids (FISH OIL) 1200 MG CAPS Take 2,400 mg by mouth at bedtime.    Allergies: Lisinopril  Social History   Tobacco Use  . Smoking status: Never Smoker  . Smokeless tobacco: Never Used  Substance Use Topics  . Alcohol use: Yes  . Drug use: Never    Family History  Problem Relation Age of Onset  . Heart attack Father        had stent   . Coronary artery disease Father   . Hypertension Father   . Diabetes Mother   . Hypertension Mother   . Heart disease Brother        CABG AT 56    Review of Systems: A 12-system review of systems was performed and was negative except as noted in the  HPI.  --------------------------------------------------------------------------------------------------  Physical Exam: BP 120/80 (BP Location: Left Arm, Patient Position: Sitting, Cuff Size: Normal)   Pulse 61   Ht 5\' 5"  (1.651 m)   Wt 144 lb (65.3 kg)   SpO2 98%   BMI 23.96 kg/m   General:  NAD. Neck: No JVD or HJR. Lungs: Clear to auscultation bilaterally without wheezes or crackles. Heart: Regular rate and rhythm without murmurs, rubs, or gallops. Abdomen: Soft, nontender, nondistended. Extremities: No lower extremity edema.  EKG: Normal sinus rhythm with right bundle-branch block.  No significant change from prior tracing on 02/27/2020.  Lab Results  Component Value Date   WBC 8.9 03/03/2019   HGB 12.5 (L) 03/03/2019   HCT 38.0 (L) 03/03/2019   MCV 94.1 03/03/2019   PLT 169 03/03/2019    Lab Results  Component Value Date   NA 141 03/17/2020   K 3.8 03/17/2020   CL 103 03/17/2020   CO2 25 03/17/2020   BUN 11 03/17/2020   CREATININE 0.78 03/17/2020   GLUCOSE 81 03/17/2020    Outside labs (05/28/2020): Total cholesterol 99, HDL 40, LDL 46, triglycerides 56, hemoglobin A1c 6.8  --------------------------------------------------------------------------------------------------  ASSESSMENT AND PLAN: Coronary artery disease without angina: Mr. Minehart continues to do well status post PCI to the LAD approximately 1.5 years ago.  We will continue his current medications for secondary prevention including clopidogrel and atorvastatin.  I encouraged Mr. Erman to continue to increase his activity.  Hypertension: Blood pressure upper normal today with DBP of 80 mmHg.  We will continue current dose of losartan.  Hyperlipidemia associated with type 2 diabetes mellitus: Lipids well controlled on check with Dr. Betti Cruz in 05/2020.  Continue atorvastatin 20 mg daily and diet control of diabetes.  Follow-up: Return to clinic in 6 months  06/2020, MD 08/06/2020 8:42  AM

## 2020-08-06 NOTE — Patient Instructions (Signed)
Medication Instructions:  Your physician recommends that you continue on your current medications as directed. Please refer to the Current Medication list given to you today.  *If you need a refill on your cardiac medications before your next appointment, please call your pharmacy*  Follow-Up: At [facility], you and your health needs are our priority.  As part of our continuing mission to provide you with exceptional heart care, we have created designated Provider Care Teams.  These Care Teams include your primary Cardiologist (physician) and Advanced Practice Providers (APPs -  Physician Assistants and Nurse Practitioners) who all work together to provide you with the care you need, when you need it.  We recommend signing up for the patient portal called "MyChart".  Sign up information is provided on this After Visit Summary.  MyChart is used to connect with patients for Virtual Visits (Telemedicine).  Patients are able to view lab/test results, encounter notes, upcoming appointments, etc.  Non-urgent messages can be sent to your provider as well.   To learn more about what you can do with MyChart, go to [email or web address].    Your next appointment:   6 month(s)  The format for your next appointment:   In Person  Provider:   You may see DR CHRISTOPHER END or one of the following Advanced Practice Providers on your designated Care Team:    Christopher Berge, NP  Ryan Dunn, PA-C  Jacquelyn Visser, PA-C  Cadence Furth, PA-C  Caitlin Walker, NP  

## 2020-08-26 ENCOUNTER — Telehealth: Payer: Self-pay | Admitting: Internal Medicine

## 2020-08-26 ENCOUNTER — Other Ambulatory Visit: Payer: Self-pay

## 2020-08-26 MED ORDER — LOSARTAN POTASSIUM 100 MG PO TABS
100.0000 mg | ORAL_TABLET | Freq: Every day | ORAL | 1 refills | Status: DC
Start: 2020-08-26 — End: 2020-08-28

## 2020-08-26 NOTE — Telephone Encounter (Signed)
losartan (COZAAR) 100 MG tablet 90 tablet 1 08/26/2020 11/24/2020   Sig - Route: Take 1 tablet (100 mg total) by mouth daily. - Oral   Sent to pharmacy as: losartan (COZAAR) 100 MG tablet   E-Prescribing Status: Receipt confirmed by pharmacy (08/26/2020 10:27 AM EDT)     Pharmacy  [facility] [phone] - [place], [place] - [address]

## 2020-08-26 NOTE — Telephone Encounter (Signed)
*  STAT* If patient is at the pharmacy, call can be transferred to refill team.   1. Which medications need to be refilled? (please list name of each medication and dose if known)   Losartan 100 mg po q d   2. Which pharmacy/location (including street and city if local pharmacy) is medication to be sent to?  [facility] [place]  3. Do they need a 30 day or 90 day supply? 90

## 2020-08-27 ENCOUNTER — Ambulatory Visit: Payer: BC Managed Care – PPO | Admitting: Internal Medicine

## 2020-08-28 ENCOUNTER — Other Ambulatory Visit: Payer: Self-pay | Admitting: Internal Medicine

## 2021-02-03 ENCOUNTER — Telehealth: Payer: Self-pay | Admitting: Internal Medicine

## 2021-02-03 MED ORDER — CLOPIDOGREL BISULFATE 75 MG PO TABS: 75.0000 mg | ORAL_TABLET | Freq: Every day | ORAL | 3 refills | Status: DC

## 2021-02-03 MED ORDER — LOSARTAN POTASSIUM 100 MG PO TABS: 100.0000 mg | ORAL_TABLET | Freq: Every day | ORAL | 3 refills | Status: DC

## 2021-02-03 NOTE — Telephone Encounter (Signed)
*  STAT* If patient is at the pharmacy, call can be transferred to refill team.   1. Which medications need to be refilled? (please list name of each medication and dose if known)  Losartan 100 MG 1 tablet every day  Clopidogrel 75 MG 1 tablet by mouth daily with breakfast   2. Which pharmacy/location (including street and city if local pharmacy) is [facility] to[facility] [place]   3. Do they need a 30 day or 90 day supply? 90 day

## 2021-02-04 ENCOUNTER — Ambulatory Visit: Payer: BC Managed Care – PPO | Admitting: Internal Medicine

## 2021-04-29 DIAGNOSIS — Z8249 Family history of ischemic heart disease and other diseases of the circulatory system: Secondary | ICD-10-CM | POA: Diagnosis not present

## 2021-04-29 DIAGNOSIS — E785 Hyperlipidemia, unspecified: Secondary | ICD-10-CM | POA: Diagnosis not present

## 2021-04-29 DIAGNOSIS — I10 Essential (primary) hypertension: Secondary | ICD-10-CM | POA: Diagnosis not present

## 2021-04-29 DIAGNOSIS — Z7902 Long term (current) use of antithrombotics/antiplatelets: Secondary | ICD-10-CM | POA: Diagnosis not present

## 2021-04-29 DIAGNOSIS — Z955 Presence of coronary angioplasty implant and graft: Secondary | ICD-10-CM | POA: Diagnosis not present

## 2021-04-29 DIAGNOSIS — I2510 Atherosclerotic heart disease of native coronary artery without angina pectoris: Secondary | ICD-10-CM | POA: Diagnosis not present

## 2021-04-29 DIAGNOSIS — I951 Orthostatic hypotension: Secondary | ICD-10-CM | POA: Diagnosis not present

## 2021-04-29 DIAGNOSIS — Z7722 Contact with and (suspected) exposure to environmental tobacco smoke (acute) (chronic): Secondary | ICD-10-CM | POA: Diagnosis not present

## 2021-04-29 DIAGNOSIS — Z008 Encounter for other general examination: Secondary | ICD-10-CM | POA: Diagnosis not present

## 2021-05-01 DIAGNOSIS — M79672 Pain in left foot: Secondary | ICD-10-CM | POA: Diagnosis not present

## 2021-05-01 DIAGNOSIS — Z0000 Encounter for general adult medical examination without abnormal findings: Secondary | ICD-10-CM | POA: Diagnosis not present

## 2021-05-01 DIAGNOSIS — E119 Type 2 diabetes mellitus without complications: Secondary | ICD-10-CM | POA: Diagnosis not present

## 2021-05-01 DIAGNOSIS — Z6825 Body mass index (BMI) 25.0-25.9, adult: Secondary | ICD-10-CM | POA: Diagnosis not present

## 2021-05-01 DIAGNOSIS — I2510 Atherosclerotic heart disease of native coronary artery without angina pectoris: Secondary | ICD-10-CM | POA: Diagnosis not present

## 2021-05-01 DIAGNOSIS — E7800 Pure hypercholesterolemia, unspecified: Secondary | ICD-10-CM | POA: Diagnosis not present

## 2021-05-01 DIAGNOSIS — I10 Essential (primary) hypertension: Secondary | ICD-10-CM | POA: Diagnosis not present

## 2021-05-01 DIAGNOSIS — Z23 Encounter for immunization: Secondary | ICD-10-CM | POA: Diagnosis not present

## 2021-05-05 ENCOUNTER — Ambulatory Visit (INDEPENDENT_AMBULATORY_CARE_PROVIDER_SITE_OTHER): Payer: BC Managed Care – PPO

## 2021-05-05 ENCOUNTER — Ambulatory Visit (INDEPENDENT_AMBULATORY_CARE_PROVIDER_SITE_OTHER): Payer: Medicare HMO | Admitting: Podiatry

## 2021-05-05 ENCOUNTER — Other Ambulatory Visit: Payer: Self-pay

## 2021-05-05 DIAGNOSIS — M722 Plantar fascial fibromatosis: Secondary | ICD-10-CM

## 2021-05-05 DIAGNOSIS — M778 Other enthesopathies, not elsewhere classified: Secondary | ICD-10-CM | POA: Diagnosis not present

## 2021-05-05 MED ORDER — METHYLPREDNISOLONE 4 MG PO TBPK: ORAL_TABLET | ORAL | 0 refills | Status: DC

## 2021-05-05 NOTE — Progress Notes (Signed)
Subjective:  Patient ID: Jason Suarez, male    DOB: Nov 17, 1955,  MRN: [id number] HPI Chief Complaint  Patient presents with   Foot Pain    Pt reports for the last 3 months he has been having pain on the lateral side of foot- no injures but when walking he feels pain near 5th toe     65 y.o. male presents with the above complaint.   ROS: Denies fever chills nausea vomiting muscle aches pains calf pain back pain chest pain shortness of breath.  Past Medical History:  Diagnosis Date   Anxiety    Back pain    CAD in native artery 03/03/2019   Chest pain    Chest pressure 03/03/2012   Dizziness    DM (diabetes mellitus) (HCC)    Hypercholesterolemia    Hypertension    Light headedness    Low HDL (under 40)    Overweight    RBBB 03/03/2012   S/P angioplasty with stent and atherectomy to proximal/mid LAD with Resolute Onyx,  and PTCA to small D2 that was jailed 03/03/2019   Syncopal episodes    Past Surgical History:  Procedure Laterality Date   CORONARY ATHERECTOMY N/A 03/02/2019   Procedure: CORONARY ATHERECTOMY;  Surgeon: Yvonne Kendall, MD;  [facility];  Service: Cardiovascular;  Laterality: N/A;   CORONARY STENT INTERVENTION N/A 03/02/2019   Procedure: CORONARY STENT INTERVENTION;  Surgeon: Yvonne Kendall, MD;  [facility];  Service: Cardiovascular;  Laterality: N/A;   INTRAVASCULAR ULTRASOUND/IVUS N/A 03/02/2019   Procedure: Intravascular Ultrasound/IVUS;  Surgeon: Yvonne Kendall, MD;  [facility];  Service: Cardiovascular;  Laterality: N/A;   KNEE ARTHROSCOPY     left   LEFT HEART CATH AND CORONARY ANGIOGRAPHY N/A 03/02/2019   Procedure: LEFT HEART CATH AND CORONARY ANGIOGRAPHY;  Surgeon: Yvonne Kendall, MD;  [facility]: [facility];  Service: Cardiovascular;  Laterality: N/A;   SHOULDER SURGERY     right    SURGERY SCROTAL / TESTICULAR      Current Outpatient Medications:    methylPREDNISolone (MEDROL  DOSEPAK) 4 MG TBPK tablet, 6 day dose pack - take as directed, Disp: 21 tablet, Rfl: 0   atorvastatin (LIPITOR) 20 MG tablet, Take 20 mg by mouth every evening. , Disp: , Rfl:    atorvastatin (LIPITOR) 20 MG tablet, Take 1 tablet by mouth daily., Disp: , Rfl:    clopidogrel (PLAVIX) 75 MG tablet, Take 1 tablet (75 mg total) by mouth daily with breakfast., Disp: 90 tablet, Rfl: 3   losartan (COZAAR) 100 MG tablet, Take 1 tablet (100 mg total) by mouth daily., Disp: 90 tablet, Rfl: 3   Multiple Vitamins-Minerals (MULTIVITAMINS THER. W/MINERALS) TABS, Take 1 tablet by mouth at bedtime. , Disp: , Rfl:    nitroGLYCERIN (NITROSTAT) 0.4 MG SL tablet, PLACE 1 TABLET (0.4 MG TOTAL) UNDER THE TONGUE EVERY 5 (FIVE) MINUTES AS NEEDED FOR CHEST PAIN., Disp: 25 tablet, Rfl: 3   Omega-3 Fatty Acids (FISH OIL) 1200 MG CAPS, Take 2,400 mg by mouth at bedtime., Disp: , Rfl:   Allergies  Allergen Reactions   Lisinopril Swelling    Lip Inflammation  Other reaction(s): lip swelling   Review of Systems Objective:  There were no vitals filed for this visit.  General: Well developed, nourished, in no acute distress, alert and oriented x3   Dermatological: Skin is warm, dry and supple bilateral. Nails x 10 are well maintained; remaining integument appears unremarkable at this  time. There are no open sores, no preulcerative lesions, no rash or signs of infection present.  Vascular: Dorsalis Pedis artery and Posterior Tibial artery pedal pulses are 2/4 bilateral with immedate capillary fill time. Pedal hair growth present. No varicosities and no lower extremity edema present bilateral.   Neruologic: Grossly intact via light touch bilateral. Vibratory intact via tuning fork bilateral. Protective threshold with Semmes Wienstein monofilament intact to all pedal sites bilateral. Patellar and Achilles deep tendon reflexes 2+ bilateral. No Babinski or clonus noted bilateral.   Musculoskeletal: No gross boney pedal  deformities bilateral. No pain, crepitus, or limitation noted with foot and ankle range of motion bilateral. Muscular strength 5/5 in all groups tested bilateral.  Pain on palpation medial calcaneal tubercle of the left heel no pain medial lateral compression of the calcaneus.  No pain on palpation of the Achilles.  Gait: Unassisted, Nonantalgic.    Radiographs:  Radiographs taken today demonstrate soft tissue increase in density piperocaine insertion site of the left heel consistent with Planter fasciitis.  Assessment & Plan:   Assessment: Planter fasciitis left.  Plan: Discussed etiology pathology conservative surgical therapies at this point time injected his left heel 20 mg Kenalog 5 mg Marcaine point of maximal tenderness at the plantar fashion calcaneal insertion site start him on methylprednisolone did not use any NSAIDs because of his cardiac condition but we did place him in a plantar fascial brace we discussed appropriate shoe gear stretching exercises ice therapy sugar modifications I will follow-up with him in 1 month     Aluna Whiston T. [place], [place]

## 2021-05-05 NOTE — Patient Instructions (Signed)
Plantar Fasciitis Rehab Ask your health care provider which exercises are safe for you. Do exercises exactly as told by your health care provider and adjust them as directed. It is normal to feel mild stretching, pulling, tightness, or discomfort as you do these exercises. Stop right away if you feel sudden pain or your pain gets worse. Do not begin these exercises until told by your health care provider. Stretching and range-of-motion exercises These exercises warm up your muscles and joints and improve the movement and flexibility of your foot. These exercises also help to relieve pain. Plantar fascia stretch  Sit with your left / right leg crossed over your opposite knee. Hold your heel with one hand with that thumb near your arch. With your other hand, hold your toes and gently pull them back toward the top of your foot. You should feel a stretch on the base (bottom) of your toes, or the bottom of your foot (plantar fascia), or both. Hold this stretch for__________ seconds. Slowly release your toes and return to the starting position. Repeat __________ times. Complete this exercise __________ times a day. Gastrocnemius stretch, standing This exercise is also called a calf (gastroc) stretch. It stretches the muscles in the back of the upper calf. Stand with your hands against a wall. Extend your left / right leg behind you, and bend your front knee slightly. Keeping your heels on the floor, your toes facing forward, and your back knee straight, shift your weight toward the wall. Do not arch your back. You should feel a gentle stretch in your upper calf. Hold this position for __________ seconds. Repeat __________ times. Complete this exercise __________ times a day. Soleus stretch, standing This exercise is also called a calf (soleus) stretch. It stretches the muscles in the back of the lower calf. Stand with your hands against a wall. Extend your left / right leg behind you, and bend your  front knee slightly. Keeping your heels on the floor and your toes facing forward, bend your back knee and shift your weight slightly over your back leg. You should feel a gentle stretch deep in your lower calf. Hold this position for __________ seconds. Repeat __________ times. Complete this exercise __________ times a day. Gastroc and soleus stretch, standing step This exercise stretches the muscles in the back of the lower leg. These muscles are in the upper calf (gastrocnemius) and the lower calf (soleus). Stand with the ball of your left / right foot on the front of a step. The ball of your foot is on the walking surface, right under your toes. Keep your other foot firmly on the same step. Hold on to the wall or a railing for balance. Slowly lift your other foot, allowing your body weight to press your heel down over the edge of the front of the step. Keep knee straight and unbent. You should feel a stretch in your calf. Hold this position for __________ seconds. Return both feet to the step. Repeat this exercise with a slight bend in your left / right knee. Repeat __________ times with your left / right knee straight and __________ times with your left / right knee bent. Complete this exercise __________ times a day. Balance exercise This exercise builds your balance and strength control of your arch to help take pressure off your plantar fascia. Single leg stand If this exercise is too easy, you can try it with your eyes closed or while standing on a pillow. Without shoes, stand near a   railing or in a doorway. You may hold on to the railing or door frame as needed. Stand on your left / right foot. Keep your big toe down on the floor and lift the arch of your foot. You should feel a stretch across the bottom of your foot and your arch. Do not let your foot roll inward. Hold this position for __________ seconds. Repeat __________ times. Complete this exercise __________ times a day. This  information is not intended to replace advice given to you by your health care provider. Make sure you discuss any questions you have with your health care provider. Document Revised: 03/13/2020 Document Reviewed: 03/13/2020 Elsevier Patient Education  2022 Elsevier Inc.  

## 2021-05-19 ENCOUNTER — Ambulatory Visit: Payer: BC Managed Care – PPO | Admitting: Podiatry

## 2021-05-21 ENCOUNTER — Other Ambulatory Visit: Payer: Self-pay

## 2021-05-21 ENCOUNTER — Ambulatory Visit: Payer: BC Managed Care – PPO | Admitting: Nurse Practitioner

## 2021-05-21 ENCOUNTER — Encounter: Payer: Self-pay | Admitting: Nurse Practitioner

## 2021-05-21 VITALS — BP 118/72 | HR 59 | Ht 65.0 in | Wt 153.4 lb

## 2021-05-21 DIAGNOSIS — I10 Essential (primary) hypertension: Secondary | ICD-10-CM

## 2021-05-21 DIAGNOSIS — I2510 Atherosclerotic heart disease of native coronary artery without angina pectoris: Secondary | ICD-10-CM

## 2021-05-21 DIAGNOSIS — E785 Hyperlipidemia, unspecified: Secondary | ICD-10-CM | POA: Diagnosis not present

## 2021-05-21 DIAGNOSIS — E1159 Type 2 diabetes mellitus with other circulatory complications: Secondary | ICD-10-CM

## 2021-05-21 MED ORDER — LOSARTAN POTASSIUM 100 MG PO TABS: 100.0000 mg | ORAL_TABLET | Freq: Every day | ORAL | 3 refills | Status: DC

## 2021-05-21 MED ORDER — CLOPIDOGREL BISULFATE 75 MG PO TABS: 75.0000 mg | ORAL_TABLET | Freq: Every day | ORAL | 3 refills | Status: DC

## 2021-05-21 NOTE — Patient Instructions (Signed)
Medication Instructions:  ?No changes at this time.  ? ?*If you need a refill on your cardiac medications before your next appointment, please call your pharmacy* ? ? ?Lab Work: ?None ? ?If you have labs (blood work) drawn today and your tests are completely normal, you will receive your results only by: ?MyChart Message (if you have MyChart) OR ?A paper copy in the mail ?If you have any lab test that is abnormal or we need to change your treatment, we will call you to review the results. ? ? ?Testing/Procedures: ?None ? ? ?Follow-Up: ?At [facility], you and your health needs are our priority.  As part of our continuing mission to provide you with exceptional heart care, we have created designated Provider Care Teams.  These Care Teams include your primary Cardiologist (physician) and Advanced Practice Providers (APPs -  Physician Assistants and Nurse Practitioners) who all work together to provide you with the care you need, when you need it. ? ? ?Your next appointment:   ?6 month(s) ? ?The format for your next appointment:   ?In Person ? ?Provider:   ?Christopher End, MD or Christopher Berge, NP ?

## 2021-05-21 NOTE — Progress Notes (Signed)
Office Visit    Patient Name: Jason Suarez Date of Encounter: 05/21/2021  Primary Care Provider:  Clayborn Heron, MD Primary Cardiologist:  Jason Kendall, MD  Chief Complaint    65 y /o ? w/o a h/o CAD s/p prior LAD stenting in 02/2019, HTN, HL, and DMII, who presents for f/u of CAD.  Past Medical History    Past Medical History:  Diagnosis Date   Anxiety    Back pain    CAD in native artery 03/03/2019   a. 02/2019 PCI: LM nl, LAD 70p/m, 50/61m (3.0x34 Resolute Onyx DES), D2 70, LCX 50p, RCA 20p/55m, EF 65%.   Dizziness    DM (diabetes mellitus) (HCC)    Hyperlipidemia LDL goal <70    Hypertension    Light headedness    Low HDL (under 40)    Overweight    RBBB 03/03/2012   S/P angioplasty with stent and atherectomy to proximal/mid LAD with Resolute Onyx,  and PTCA to small D2 that was jailed 03/03/2019   Syncopal episodes    Past Surgical History:  Procedure Laterality Date   CORONARY ATHERECTOMY N/A 03/02/2019   Procedure: CORONARY ATHERECTOMY;  Surgeon: Jason Kendall, MD;  [facility];  Service: Cardiovascular;  Laterality: N/A;   CORONARY STENT INTERVENTION N/A 03/02/2019   Procedure: CORONARY STENT INTERVENTION;  Surgeon: Jason Kendall, MD;  [facility]: [facility];  Service: Cardiovascular;  Laterality: N/A;   INTRAVASCULAR ULTRASOUND/IVUS N/A 03/02/2019   Procedure: Intravascular Ultrasound/IVUS;  Surgeon: Jason Kendall, MD;  [facility]: [facility];  Service: Cardiovascular;  Laterality: N/A;   KNEE ARTHROSCOPY     left   LEFT HEART CATH AND CORONARY ANGIOGRAPHY N/A 03/02/2019   Procedure: LEFT HEART CATH AND CORONARY ANGIOGRAPHY;  Surgeon: Jason Kendall, MD;  [facility];  Service: Cardiovascular;  Laterality: N/A;   SHOULDER SURGERY     right    SURGERY SCROTAL / TESTICULAR      Allergies  Allergies  Allergen Reactions   Lisinopril Swelling    Lip Inflammation  Other reaction(s): lip  swelling    History of Present Illness    65 y/o ?  With the above past medical history including CAD, hypertension, hyperlipidemia, and type 2 diabetes mellitus.  He was previously evaluated in 2020 in the setting of unstable angina with CT FFR in September 2020 revealing significant LAD disease.  He subsequently underwent diagnostic catheterization and PCI drug-eluting stent placement to the proximal and mid LAD.  He was last seen in cardiology clinic in February 2022, at which time he was doing well.  Since his last visit, [name] has continued to do well.  He is walking 45 to 60 minutes most days of the week.  He typically does this on a flat surface and does not experience any chest pain or dyspnea.  When he has to walk up a steep hill, he will experience dyspnea however, this is unchanged from prior experiences over the years.  He sometimes notes burning in his chest when he eats spicy foods but otherwise denies chest pain.  He denies palpitations, PND, orthopnea, dizziness, syncope, edema, or early satiety.  Home Medications    Current Outpatient Medications  Medication Sig Dispense Refill   atorvastatin (LIPITOR) 20 MG tablet Take 20 mg by mouth every evening.      Multiple Vitamins-Minerals (MULTIVITAMINS THER. W/MINERALS) TABS Take 1 tablet by mouth at bedtime.      nitroGLYCERIN (  NITROSTAT) 0.4 MG SL tablet PLACE 1 TABLET (0.4 MG TOTAL) UNDER THE TONGUE EVERY 5 (FIVE) MINUTES AS NEEDED FOR CHEST PAIN. 25 tablet 3   Omega-3 Fatty Acids (FISH OIL) 1200 MG CAPS Take 2,400 mg by mouth at bedtime.     atorvastatin (LIPITOR) 20 MG tablet Take 1 tablet by mouth daily.     clopidogrel (PLAVIX) 75 MG tablet Take 1 tablet (75 mg total) by mouth daily with breakfast. 90 tablet 3   losartan (COZAAR) 100 MG tablet Take 1 tablet (100 mg total) by mouth daily. 90 tablet 3   methylPREDNISolone (MEDROL DOSEPAK) 4 MG TBPK tablet 6 day dose pack - take as directed (Patient not taking: Reported on  05/21/2021) 21 tablet 0   No current facility-administered medications for this visit.     Review of Systems    Experiences dyspnea on exertion when walking up hills.  Occasional GERD symptoms when eating spicy foods.  He otherwise denies angina, palpitations, PND, orthopnea, dizziness, syncope, edema, or early satiety.  All other systems reviewed and are otherwise negative except as noted above.    Physical Exam    VS:  BP 118/72 (BP Location: Left Arm, Patient Position: Sitting, Cuff Size: Normal)   Pulse (!) 59   Ht 5\' 5"  (1.651 m)   Wt 153 lb 6.4 oz (69.6 kg)   SpO2 100%   BMI 25.53 kg/m  , BMI Body mass index is 25.53 kg/m.     GEN: Well nourished, well developed, in no acute distress. HEENT: normal. Neck: Supple, no JVD, carotid bruits, or masses. Cardiac: RRR, no murmurs, rubs, or gallops. No clubbing, cyanosis, edema.  Radials/PT 2+ and equal bilaterally.  Respiratory:  Respirations regular and unlabored, clear to auscultation bilaterally. GI: Soft, nontender, nondistended, BS + x 4. MS: no deformity or atrophy. Skin: warm and dry, no rash. Neuro:  Strength and sensation are intact. Psych: Normal affect.  Accessory Clinical Findings    ECG personally reviewed by me today -sinus bradycardia, 59, right bundle branch block- no acute changes.  Recent labs from [facility]-November 2022  H/H 13.3/39.6, WBC 8.7, PLT 223 Sodium 140, potassium 5.0, chloride 106, CO2 29, BUN 13, creatinine 0.80, glucose 110 Total bilirubin 0.7, alkaline phosphatase 76, AST 22, ALT 21 TC 112, Trig 75, HDL 32, LDL 65 Hemoglobin [birth year]-1974 6.9  Assessment & Plan    1.  Coronary artery disease: Status post PCI and drug-eluting stent placement to the proximal mid LAD in September 2020.  He remains active and is exercising most days of the week, walking 45 to 60 minutes.  He does not experience chest pain with activity, though sometimes notes dyspnea when walking up inclines, which is unchanged  over the years.  He remains on statin, Plavix, and ARB therapy.  2.  Essential hypertension: Blood pressures well controlled at 118/72 on losartan therapy.  Recent normal BUN and creatinine.  3.  Hyperlipidemia: Recent labs show an LDL of 65.  We discussed that in the setting of coronary disease with diabetes and hypertension, new guidelines suggest goal LDL less than 55.  Patient notes that historically, his LDL has run in the low 50s but he is traveling more for work and has noted some dietary indiscretions.  We agreed to try lifestyle modifications over the next 6 months prior to considering further titration of atorvastatin therapy.  4.  Type 2 diabetes mellitus: This is diet controlled with an A1c of 6.9.  Lifestyle modifications encouraged as  outlined above.  5.  Disposition: Follow-up in clinic in 6 months or sooner if necessary.  Nicolasa Ducking, NP 05/21/2021, 8:42 AM

## 2021-05-22 DIAGNOSIS — E119 Type 2 diabetes mellitus without complications: Secondary | ICD-10-CM | POA: Diagnosis not present

## 2021-05-22 DIAGNOSIS — H2513 Age-related nuclear cataract, bilateral: Secondary | ICD-10-CM | POA: Diagnosis not present

## 2021-05-22 DIAGNOSIS — H5203 Hypermetropia, bilateral: Secondary | ICD-10-CM | POA: Diagnosis not present

## 2021-10-24 DIAGNOSIS — Z888 Allergy status to other drugs, medicaments and biological substances status: Secondary | ICD-10-CM | POA: Diagnosis not present

## 2021-10-24 DIAGNOSIS — Z7902 Long term (current) use of antithrombotics/antiplatelets: Secondary | ICD-10-CM | POA: Diagnosis not present

## 2021-10-24 DIAGNOSIS — E1165 Type 2 diabetes mellitus with hyperglycemia: Secondary | ICD-10-CM | POA: Diagnosis not present

## 2021-10-24 DIAGNOSIS — M1990 Unspecified osteoarthritis, unspecified site: Secondary | ICD-10-CM | POA: Diagnosis not present

## 2021-10-24 DIAGNOSIS — Z955 Presence of coronary angioplasty implant and graft: Secondary | ICD-10-CM | POA: Diagnosis not present

## 2021-10-24 DIAGNOSIS — I10 Essential (primary) hypertension: Secondary | ICD-10-CM | POA: Diagnosis not present

## 2021-10-24 DIAGNOSIS — Z008 Encounter for other general examination: Secondary | ICD-10-CM | POA: Diagnosis not present

## 2021-10-24 DIAGNOSIS — E785 Hyperlipidemia, unspecified: Secondary | ICD-10-CM | POA: Diagnosis not present

## 2021-11-13 DIAGNOSIS — E7800 Pure hypercholesterolemia, unspecified: Secondary | ICD-10-CM | POA: Diagnosis not present

## 2021-11-13 DIAGNOSIS — I2510 Atherosclerotic heart disease of native coronary artery without angina pectoris: Secondary | ICD-10-CM | POA: Diagnosis not present

## 2021-11-13 DIAGNOSIS — E1169 Type 2 diabetes mellitus with other specified complication: Secondary | ICD-10-CM | POA: Diagnosis not present

## 2021-11-13 DIAGNOSIS — I10 Essential (primary) hypertension: Secondary | ICD-10-CM | POA: Diagnosis not present

## 2021-11-25 ENCOUNTER — Ambulatory Visit: Payer: Medicare HMO | Admitting: Internal Medicine

## 2021-11-25 ENCOUNTER — Encounter: Payer: Self-pay | Admitting: Internal Medicine

## 2021-11-25 VITALS — BP 120/76 | HR 48 | Ht 65.5 in | Wt 149.0 lb

## 2021-11-25 DIAGNOSIS — E1169 Type 2 diabetes mellitus with other specified complication: Secondary | ICD-10-CM | POA: Diagnosis not present

## 2021-11-25 DIAGNOSIS — E785 Hyperlipidemia, unspecified: Secondary | ICD-10-CM

## 2021-11-25 DIAGNOSIS — I25119 Atherosclerotic heart disease of native coronary artery with unspecified angina pectoris: Secondary | ICD-10-CM

## 2021-11-25 DIAGNOSIS — R001 Bradycardia, unspecified: Secondary | ICD-10-CM | POA: Diagnosis not present

## 2021-11-25 DIAGNOSIS — I10 Essential (primary) hypertension: Secondary | ICD-10-CM | POA: Diagnosis not present

## 2021-11-25 DIAGNOSIS — R0609 Other forms of dyspnea: Secondary | ICD-10-CM | POA: Diagnosis not present

## 2021-11-25 NOTE — Progress Notes (Signed)
Follow-up Outpatient Visit Date: 11/25/2021  Primary Care Provider: Clayborn Heron, MD [address] [place] [address]  Chief Complaint: Dyspnea on exertion  HPI:  Jason Suarez is a 66 y.o. male with history of coronary artery disease status post PCI to the LAD in 02/2019, hypertension, hyperlipidemia, and diabetes mellitus, who presents for follow-up of coronary artery disease.  He was last seen in our office in 05/2021 by Ward Givens, NP, at which time he continued to do well other than stable exertional dyspnea when walking up is steep hill.  No medication changes or additional testing were pursued.  Today, Jason Suarez reports feeling about the same as at his last visit, though he is concerned that he might get out of breath a little bit more easily when going up stairs.  It sometimes takes him several minutes to catch his breath.  He has not had any chest pain, palpitations, lightheadedness, or edema.  He remains compliant with his medications, which she is tolerating well.  He recently had labs through his PCP, which he brings with him today (see below).  --------------------------------------------------------------------------------------------------  Past Medical History:  Diagnosis Date   Anxiety    Back pain    CAD in native artery 03/03/2019   a. 02/2019 PCI: LM nl, LAD 70p/m, 50/59m (3.0x34 Resolute Onyx DES), D2 70, LCX 50p, RCA 20p/42m, EF 65%.   Dizziness    DM (diabetes mellitus) (HCC)    Hyperlipidemia LDL goal <70    Hypertension    Light headedness    Low HDL (under 40)    Overweight    RBBB 03/03/2012   S/P angioplasty with stent and atherectomy to proximal/mid LAD with Resolute Onyx,  and PTCA to small D2 that was jailed 03/03/2019   Syncopal episodes    Past Surgical History:  Procedure Laterality Date   CORONARY ATHERECTOMY N/A 03/02/2019   Procedure: CORONARY ATHERECTOMY;  Surgeon: Yvonne Kendall, MD;  [facility];  Service:  Cardiovascular;  Laterality: N/A;   CORONARY STENT INTERVENTION N/A 03/02/2019   Procedure: CORONARY STENT INTERVENTION;  Surgeon: Yvonne Kendall, MD;  [facility];  Service: Cardiovascular;  Laterality: N/A;   INTRAVASCULAR ULTRASOUND/IVUS N/A 03/02/2019   Procedure: Intravascular Ultrasound/IVUS;  Surgeon: Yvonne Kendall, MD;  [facility];  Service: Cardiovascular;  Laterality: N/A;   KNEE ARTHROSCOPY     left   LEFT HEART CATH AND CORONARY ANGIOGRAPHY N/A 03/02/2019   Procedure: LEFT HEART CATH AND CORONARY ANGIOGRAPHY;  Surgeon: Yvonne Kendall, MD;  [facility];  Service: Cardiovascular;  Laterality: N/A;   SHOULDER SURGERY     right    SURGERY SCROTAL / TESTICULAR      Current Meds  Medication Sig   atorvastatin (LIPITOR) 20 MG tablet Take 1 tablet by mouth daily.   clopidogrel (PLAVIX) 75 MG tablet Take 1 tablet (75 mg total) by mouth daily with breakfast.   losartan (COZAAR) 100 MG tablet Take 1 tablet (100 mg total) by mouth daily.   Multiple Vitamins-Minerals (MULTIVITAMINS THER. W/MINERALS) TABS Take 1 tablet by mouth at bedtime.    nitroGLYCERIN (NITROSTAT) 0.4 MG SL tablet PLACE 1 TABLET (0.4 MG TOTAL) UNDER THE TONGUE EVERY 5 (FIVE) MINUTES AS NEEDED FOR CHEST PAIN.   Omega-3 Fatty Acids (FISH OIL) 1200 MG CAPS Take 2,400 mg by mouth at bedtime.    Allergies: Lisinopril  Social History   Tobacco Use   Smoking status: Never  Smokeless tobacco: Never  Vaping Use   Vaping Use: Never used  Substance Use Topics   Alcohol use: Yes    Comment: beer on occassion   Drug use: Never    Family History  Problem Relation Age of Onset   Diabetes Mother    Hypertension Mother    Heart attack Father        had stent    Coronary artery disease Father    Hypertension Father    Heart disease Brother        CABG AT 31    Review of Systems: A 12-system review of systems was performed and was negative except as noted in  the HPI.  --------------------------------------------------------------------------------------------------  Physical Exam: BP 120/76 (BP Location: Left Arm, Patient Position: Sitting, Cuff Size: Normal)   Pulse (!) 48   Ht 5' 5.5" (1.664 m)   Wt 149 lb (67.6 kg)   SpO2 98%   BMI 24.42 kg/m   General:  NAD. Neck: No JVD or HJR. Lungs: Clear to auscultation bilaterally without wheezes or crackles. Heart: Regular rate and rhythm without murmurs, rubs, or gallops. Abdomen: Soft, nontender, nondistended. Extremities: No lower extremity edema.  EKG: Sinus bradycardia with sinus arrhythmia and right bundle branch block.  Heart rate has decreased since 05/21/2021.  Otherwise, no significant interval change.  Outside labs (05/04/2021): CBC (05/04/2021): WBC 8.7, Hgb 13.3, HCT 39.6, platelets 223  CMP (11/16/2021): Sodium 139, potassium 4.8, chloride 106, CO2 29, BUN 17, creatinine 0.8, glucose 130, calcium 9.6, AST 20, ALT 19, alkaline phosphatase 77, total bilirubin 0.8, total protein 7.0, albumin 4.3  Lipid panel (11/16/2021): Total cholesterol 105, triglycerides 69, HDL 34, LDL 56.  Hemoglobin A1c (11/16/2021): 7.1 --------------------------------------------------------------------------------------------------  ASSESSMENT AND PLAN: Coronary artery disease with dyspnea on exertion: Dyspnea on exertion has been chronic but seems a little worse than at prior visits.  This is concerning because it has been his anginal equivalent in the past.  We have discussed further evaluation options and have agreed to obtain an exercise myocardial perfusion stress test.  We will try to expedite this to be done within the next 2 weeks, given that Jason Suarez plans to travel back to [place] in mid July.  In the meantime, we will continue secondary prevention with clopidogrel, atorvastatin, and fish oil.  Sinus bradycardia: This is chronic though heart rate is a little bit lower than usual today.  Jason Suarez is  asymptomatic.  We will be able to assess his chronotropic response on upcoming exercise MPI.  Hypertension: Blood pressure well controlled today.  Continue losartan.  Hyperlipidemia associated with type 2 diabetes mellitus: LDL well controlled on last check earlier this month.  Continue atorvastatin 20 mg daily.  Shared Decision Making/Informed Consent The risks [chest pain, shortness of breath, cardiac arrhythmias, dizziness, blood pressure fluctuations, myocardial infarction, stroke/transient ischemic attack, nausea, vomiting, allergic reaction, radiation exposure, metallic taste sensation and life-threatening complications (estimated to be 1 in 10,000)], benefits (risk stratification, diagnosing coronary artery disease, treatment guidance) and alternatives of a nuclear stress test were discussed in detail with Jason Suarez and he agrees to proceed.  Follow-up: Return to clinic in 6 months, sooner if significant abnormality is seen on stress test worsening worsened.  Yvonne Kendall, MD 11/25/2021 1:12 PM

## 2021-11-25 NOTE — Patient Instructions (Signed)
Medication Instructions:   Your physician recommends that you continue on your current medications as directed. Please refer to the Current Medication list given to you today.  *If you need a refill on your cardiac medications before your next appointment, please call your pharmacy*   Lab Work:  None ordered  Testing/Procedures:  Your physician has requested that you have en exercise stress myoview. For further information please visit [email or web address] Please follow instruction sheet, as given:  [facility] Cardiovascular [facility] at [facility] [address], [address] [place], [place] [address] Phone: [phone]  November 25, 2021    Jason Suarez DOB: 1956-06-04 MRN: [id number] [address] [place] [address]   Dear Jason Suarez,  You are scheduled for a Myocardial Perfusion Imaging Study on: ______________at ____________.  Please arrive 15 minutes prior to your appointment time for registration and insurance purposes.  The test will take approximately 3 to 4 hours to complete; you may bring reading material.  If someone comes with you to your appointment, they will need to remain in the main lobby due to limited space in the testing area. **If you are pregnant or breastfeeding, please notify the nuclear lab prior to your appointment**  How to prepare for your Myocardial Perfusion Test: Do not eat or drink 3 hours prior to your test, except you may have water. Do not consume products containing caffeine (regular or decaffeinated) 12 hours prior to your test. (ex: coffee, chocolate, sodas, tea). Do bring a list of your current medications with you.  If not listed below, you may take your medications as normal. Do wear comfortable clothes (no dresses or overalls) and walking shoes, tennis shoes preferred (No heels or open toe shoes are allowed). Do NOT wear cologne, perfume, aftershave, or lotions (deodorant is allowed). If these instructions are not followed,  your test will have to be rescheduled.  Please report to [address] for your test.  If you have questions or concerns about your appointment, you can call the Nuclear Lab at [phone].  If you cannot keep your appointment, please provide 24 hours notification to the Nuclear Lab, to avoid a possible $50 charge to your account.   Follow-Up: At [facility], you and your health needs are our priority.  As part of our continuing mission to provide you with exceptional heart care, we have created designated Provider Care Teams.  These Care Teams include your primary Cardiologist (physician) and Advanced Practice Providers (APPs -  Physician Assistants and Nurse Practitioners) who all work together to provide you with the care you need, when you need it.  We recommend signing up for the patient portal called "MyChart".  Sign up information is provided on this After Visit Summary.  MyChart is used to connect with patients for Virtual Visits (Telemedicine).  Patients are able to view lab/test results, encounter notes, upcoming appointments, etc.  Non-urgent messages can be sent to your provider as well.   To learn more about what you can do with MyChart, go to [email or web address].    Your next appointment:   6 month(s)  The format for your next appointment:   In Person  Provider:   You may see Yvonne Kendall, MD or one of the following Advanced Practice Providers on your designated Care Team:   Nicolasa Ducking, NP Eula Listen, PA-C Cadence Fransico Michael, PA-C{  Important Information About Sugar

## 2021-11-30 ENCOUNTER — Telehealth (HOSPITAL_COMMUNITY): Payer: Self-pay

## 2021-11-30 NOTE — Telephone Encounter (Signed)
Spoke with the patient, detailed instructions given. He stated that he would be here for his test. Asked to call back with any questions. Jason Suarez EMTP 

## 2021-12-03 ENCOUNTER — Ambulatory Visit (HOSPITAL_COMMUNITY): Payer: Medicare HMO | Attending: Internal Medicine

## 2021-12-03 DIAGNOSIS — R0609 Other forms of dyspnea: Secondary | ICD-10-CM

## 2021-12-03 DIAGNOSIS — I25119 Atherosclerotic heart disease of native coronary artery with unspecified angina pectoris: Secondary | ICD-10-CM | POA: Diagnosis not present

## 2021-12-03 LAB — MYOCARDIAL PERFUSION IMAGING
Angina Index: 0
Estimated workload: 8.5
Exercise duration (min): 7 min
Exercise duration (sec): 1 s
LV dias vol: 63 mL (ref 62–150)
LV sys vol: 26 mL
MPHR: 153 {beats}/min
Nuc Stress EF: 58 %
Peak HR: 144 {beats}/min
Percent HR: 94 %
RPE: 19
Rest HR: 46 {beats}/min
Rest Nuclear Isotope Dose: 10.8 mCi
SDS: 0
SRS: 0
SSS: 0
Stress Nuclear Isotope Dose: 30.6 mCi
TID: 0.9

## 2021-12-03 MED ORDER — TECHNETIUM TC 99M TETROFOSMIN IV KIT
30.6000 | PACK | Freq: Once | INTRAVENOUS | Status: AC | PRN
  Administered 2021-12-03: 30.6 via INTRAVENOUS

## 2021-12-03 MED ORDER — TECHNETIUM TC 99M TETROFOSMIN IV KIT
10.8000 | PACK | Freq: Once | INTRAVENOUS | Status: AC | PRN
  Administered 2021-12-03: 10.8 via INTRAVENOUS

## 2022-05-03 DIAGNOSIS — I2510 Atherosclerotic heart disease of native coronary artery without angina pectoris: Secondary | ICD-10-CM | POA: Diagnosis not present

## 2022-05-03 DIAGNOSIS — E7800 Pure hypercholesterolemia, unspecified: Secondary | ICD-10-CM | POA: Diagnosis not present

## 2022-05-03 DIAGNOSIS — E1159 Type 2 diabetes mellitus with other circulatory complications: Secondary | ICD-10-CM | POA: Diagnosis not present

## 2022-05-03 DIAGNOSIS — Z125 Encounter for screening for malignant neoplasm of prostate: Secondary | ICD-10-CM | POA: Diagnosis not present

## 2022-05-03 DIAGNOSIS — Z23 Encounter for immunization: Secondary | ICD-10-CM | POA: Diagnosis not present

## 2022-05-03 DIAGNOSIS — I700 Atherosclerosis of aorta: Secondary | ICD-10-CM | POA: Diagnosis not present

## 2022-05-03 DIAGNOSIS — I10 Essential (primary) hypertension: Secondary | ICD-10-CM | POA: Diagnosis not present

## 2022-05-03 DIAGNOSIS — Z0000 Encounter for general adult medical examination without abnormal findings: Secondary | ICD-10-CM | POA: Diagnosis not present

## 2022-05-21 ENCOUNTER — Encounter: Payer: Self-pay | Admitting: *Deleted

## 2022-05-24 DIAGNOSIS — H5203 Hypermetropia, bilateral: Secondary | ICD-10-CM | POA: Diagnosis not present

## 2022-05-24 DIAGNOSIS — E119 Type 2 diabetes mellitus without complications: Secondary | ICD-10-CM | POA: Diagnosis not present

## 2022-05-26 NOTE — Progress Notes (Unsigned)
Follow-up Outpatient Visit Date: 05/27/2022  Primary Care Provider: Clayborn Heron, MD [address] [place] [address]  Chief Complaint: Follow-up coronary artery disease  HPI:  Mr. Colledge is a 66 y.o. male with history of coronary artery disease status post PCI to the LAD in 02/2019, hypertension, hyperlipidemia, and diabetes mellitus, who presents for follow-up of coronary artery disease.  I last saw him in June, at which time he reported slight increase in exertional dyspnea when climbing stairs.  We agreed to obtain an exercise MPI, which was low risk without evidence of ischemia or scar.  LVEF was normal at 58%.  Today, Mr. Jungbluth reports that he has been feeling well.  He has not had any further chest pain or shortness of breath since our last visit.  He is walking 5 days/week, typically at least for an hour at a time, without any limitations.  He also denies palpitations, lightheadedness, and edema.  He reports that his blood pressure is somewhat labile at home, which she attributes to his cuff being old and potentially needing to be replaced.  He was just started on metformin by his PCP and reports that he is tolerating it well.  Hemoglobin A1c last month was 6.9.  --------------------------------------------------------------------------------------------------  Past Medical History:  Diagnosis Date   Anxiety    Back pain    CAD in native artery 03/03/2019   a. 02/2019 PCI: LM nl, LAD 70p/m, 50/62m (3.0x34 Resolute Onyx DES), D2 70, LCX 50p, RCA 20p/26m, EF 65%.   Dizziness    DM (diabetes mellitus) (HCC)    Hyperlipidemia LDL goal <70    Hypertension    Light headedness    Low HDL (under 40)    Overweight    RBBB 03/03/2012   S/P angioplasty with stent and atherectomy to proximal/mid LAD with Resolute Onyx,  and PTCA to small D2 that was jailed 03/03/2019   Syncopal episodes    Past Surgical History:  Procedure Laterality Date   CORONARY ATHERECTOMY N/A  03/02/2019   Procedure: CORONARY ATHERECTOMY;  Surgeon: Yvonne Kendall, MD;  [facility];  Service: Cardiovascular;  Laterality: N/A;   CORONARY STENT INTERVENTION N/A 03/02/2019   Procedure: CORONARY STENT INTERVENTION;  Surgeon: Yvonne Kendall, MD;  Location: [facility];  Service: Cardiovascular;  Laterality: N/A;   INTRAVASCULAR ULTRASOUND/IVUS N/A 03/02/2019   Procedure: Intravascular Ultrasound/IVUS;  Surgeon: Yvonne Kendall, MD;  Location: [facility];  Service: Cardiovascular;  Laterality: N/A;   KNEE ARTHROSCOPY     left   LEFT HEART CATH AND CORONARY ANGIOGRAPHY N/A 03/02/2019   Procedure: LEFT HEART CATH AND CORONARY ANGIOGRAPHY;  Surgeon: Yvonne Kendall, MD;  Location: [facility];  Service: Cardiovascular;  Laterality: N/A;   SHOULDER SURGERY     right    SURGERY SCROTAL / TESTICULAR      Current Meds  Medication Sig   atorvastatin (LIPITOR) 20 MG tablet Take 1 tablet by mouth daily.   clopidogrel (PLAVIX) 75 MG tablet Take 1 tablet (75 mg total) by mouth daily with breakfast.   losartan (COZAAR) 100 MG tablet Take 1 tablet (100 mg total) by mouth daily.   metFORMIN (GLUCOPHAGE) 500 MG tablet Take 500 mg by mouth daily with breakfast.   Misc Natural Products (TUMERSAID PO) Take by mouth. Eats 2 pieces of root every other day   Multiple Vitamins-Minerals (MULTIVITAMINS THER. W/MINERALS) TABS Take 1 tablet by mouth at bedtime.    nitroGLYCERIN (NITROSTAT) 0.4  MG SL tablet PLACE 1 TABLET (0.4 MG TOTAL) UNDER THE TONGUE EVERY 5 (FIVE) MINUTES AS NEEDED FOR CHEST PAIN.   Omega-3 Fatty Acids (FISH OIL) 1200 MG CAPS Take 2,400 mg by mouth at bedtime.    Allergies: Lisinopril  Social History   Tobacco Use   Smoking status: Never   Smokeless tobacco: Never  Vaping Use   Vaping Use: Never used  Substance Use Topics   Alcohol use: Yes    Comment: beer on occassion   Drug use: Never    Family History  Problem Relation Age of Onset    Diabetes Mother    Hypertension Mother    Heart attack Father        had stent    Coronary artery disease Father    Hypertension Father    Heart disease Brother        CABG AT 28    Review of Systems: A 12-system review of systems was performed and was negative except as noted in the HPI.  --------------------------------------------------------------------------------------------------  Physical Exam: BP 120/86 (BP Location: Left Arm, Patient Position: Sitting, Cuff Size: Normal)   Pulse (!) 52   Ht 5' 5.5" (1.664 m)   Wt 147 lb (66.7 kg)   SpO2 98%   BMI 24.09 kg/m  Repeat BP: 132/80  General:  NAD. Neck: No JVD or HJR. Lungs: Clear to auscultation bilaterally without wheezes or crackles. Heart: Bradycardic but regular without murmurs, rubs, or gallops. Abdomen: Soft, nontender, nondistended. Extremities: No lower extremity edema.  EKG: Sinus bradycardia with sinus arrhythmia and right bundle branch block.  No significant change from prior tracing on 11/25/2021.  Lab Results  Component Value Date   WBC 8.9 03/03/2019   HGB 12.5 (L) 03/03/2019   HCT 38.0 (L) 03/03/2019   MCV 94.1 03/03/2019   PLT 169 03/03/2019    Lab Results  Component Value Date   NA 141 03/17/2020   K 3.8 03/17/2020   CL 103 03/17/2020   CO2 25 03/17/2020   BUN 11 03/17/2020   CREATININE 0.78 03/17/2020   GLUCOSE 81 03/17/2020   Outside labs (05/04/2022):  Lipid panel: Total cholesterol 101, triglycerides 71, HDL 32, LDL 53  Hemoglobin A1c: 6.9%  ---------------------------------------------------------------------------  ASSESSMENT AND PLAN: Coronary artery disease: Mr. Chamberland feels well without any further shortness of breath nor angina.  We will plan to continue his current regimen of clopidogrel and atorvastatin for secondary prevention.  Hypertension: Blood pressure mildly elevated today.  We have agreed to continue to work on lifestyle modifications, including regular  exercise and sodium restriction.  I encouraged Mr. Renaldo to purchase a new blood pressure cuff and monitor his blood pressure at home.  Addition of a second pharmacologic agent will need to be considered if his blood pressure remains consistently above 130/80.  Hyperlipidemia associated with type 2 diabetes mellitus: Recent lipid panel showed excellent lipid control.  Continue atorvastatin 20 mg daily.  Ongoing management of DM per PCP.  Follow-up: Return to clinic in 6 months.  Yvonne Kendall, MD 05/27/2022 9:44 AM

## 2022-05-27 ENCOUNTER — Encounter: Payer: Self-pay | Admitting: Internal Medicine

## 2022-05-27 ENCOUNTER — Ambulatory Visit: Payer: Medicare HMO | Attending: Internal Medicine | Admitting: Internal Medicine

## 2022-05-27 VITALS — BP 120/86 | HR 52 | Ht 65.5 in | Wt 147.0 lb

## 2022-05-27 DIAGNOSIS — E785 Hyperlipidemia, unspecified: Secondary | ICD-10-CM | POA: Diagnosis not present

## 2022-05-27 DIAGNOSIS — E1169 Type 2 diabetes mellitus with other specified complication: Secondary | ICD-10-CM | POA: Diagnosis not present

## 2022-05-27 DIAGNOSIS — I2510 Atherosclerotic heart disease of native coronary artery without angina pectoris: Secondary | ICD-10-CM

## 2022-05-27 DIAGNOSIS — I10 Essential (primary) hypertension: Secondary | ICD-10-CM | POA: Diagnosis not present

## 2022-05-27 NOTE — Patient Instructions (Signed)
Medication Instructions:  Your Physician recommend you continue on your current medication as directed.    *If you need a refill on your cardiac medications before your next appointment, please call your pharmacy*   Lab Work: None ordered today   Testing/Procedures: None ordered today   Follow-Up: At [facility], you and your health needs are our priority.  As part of our continuing mission to provide you with exceptional heart care, we have created designated Provider Care Teams.  These Care Teams include your primary Cardiologist (physician) and Advanced Practice Providers (APPs -  Physician Assistants and Nurse Practitioners) who all work together to provide you with the care you need, when you need it.  We recommend signing up for the patient portal called "MyChart".  Sign up information is provided on this After Visit Summary.  MyChart is used to connect with patients for Virtual Visits (Telemedicine).  Patients are able to view lab/test results, encounter notes, upcoming appointments, etc.  Non-urgent messages can be sent to your provider as well.   To learn more about what you can do with MyChart, go to [email or web address].    Your next appointment:   6 month(s)  The format for your next appointment:   In Person  Provider:   You may see Christopher End, MD or one of the following Advanced Practice Providers on your designated Care Team:   Christopher Berge, NP Ryan Dunn, PA-C Cadence Furth, PA-C Sheri Hammock, NP          

## 2022-06-29 DIAGNOSIS — M7741 Metatarsalgia, right foot: Secondary | ICD-10-CM | POA: Diagnosis not present

## 2022-06-29 DIAGNOSIS — Q66229 Congenital metatarsus adductus, unspecified foot: Secondary | ICD-10-CM | POA: Diagnosis not present

## 2022-06-29 DIAGNOSIS — R52 Pain, unspecified: Secondary | ICD-10-CM | POA: Diagnosis not present

## 2022-06-29 DIAGNOSIS — M7671 Peroneal tendinitis, right leg: Secondary | ICD-10-CM | POA: Diagnosis not present

## 2022-07-23 ENCOUNTER — Other Ambulatory Visit: Payer: Self-pay | Admitting: Internal Medicine

## 2022-09-20 DIAGNOSIS — Z008 Encounter for other general examination: Secondary | ICD-10-CM | POA: Diagnosis not present

## 2022-09-20 DIAGNOSIS — Z7984 Long term (current) use of oral hypoglycemic drugs: Secondary | ICD-10-CM | POA: Diagnosis not present

## 2022-09-20 DIAGNOSIS — Z833 Family history of diabetes mellitus: Secondary | ICD-10-CM | POA: Diagnosis not present

## 2022-09-20 DIAGNOSIS — I2510 Atherosclerotic heart disease of native coronary artery without angina pectoris: Secondary | ICD-10-CM | POA: Diagnosis not present

## 2022-09-20 DIAGNOSIS — I10 Essential (primary) hypertension: Secondary | ICD-10-CM | POA: Diagnosis not present

## 2022-09-20 DIAGNOSIS — Z7902 Long term (current) use of antithrombotics/antiplatelets: Secondary | ICD-10-CM | POA: Diagnosis not present

## 2022-09-20 DIAGNOSIS — E785 Hyperlipidemia, unspecified: Secondary | ICD-10-CM | POA: Diagnosis not present

## 2022-09-20 DIAGNOSIS — E1165 Type 2 diabetes mellitus with hyperglycemia: Secondary | ICD-10-CM | POA: Diagnosis not present

## 2022-09-20 DIAGNOSIS — Z8249 Family history of ischemic heart disease and other diseases of the circulatory system: Secondary | ICD-10-CM | POA: Diagnosis not present

## 2022-09-24 ENCOUNTER — Telehealth: Payer: Self-pay | Admitting: Internal Medicine

## 2022-09-24 NOTE — Telephone Encounter (Signed)
Called pt in regards to CP.  Reports intermittent CP that started about a week ago.  Reports chest feels heavy/ tightness.   When episodes occur pt sits down and chest tightness/ SOB goes away.  Was able to walk for 40 min today on flat surface without difficulty.  Chopped up 4 or 5 tree branchs today felt fine.  However, when walking up hill after finishing chest tightness and SOB began.  BP today 137/83-51. Advised pt when has discomfort to take NTG as ordered.  Reviewed instructions and indications for medication.  Also advised if symptoms persist or are bothersome to report to the ED for evaluation.   Has OV with Furth on 09/28/22.

## 2022-09-24 NOTE — Telephone Encounter (Signed)
Pt c/o of Chest Pain: STAT if CP now or developed within 24 hours  1. Are you having CP right now? Yes 2. Are you experiencing any other symptoms (ex. SOB, nausea, vomiting, sweating)? No  3. How long have you been experiencing CP? For a week  4. Is your CP continuous or coming and going? Coming and going 5. Have you taken Nitroglycerin? No  Patient states that their chest feels heaving and tight. It has been coming and going for about a week now. ?

## 2022-09-26 NOTE — Telephone Encounter (Signed)
Thank you for the update.  I agree with plan as outlined in your note.  Yvonne Kendall, MD [facility]

## 2022-09-27 NOTE — Progress Notes (Unsigned)
Cardiology Office Note:    Date:  09/28/2022   ID:  Jason Suarez, DOB Mar 27, 1956, MRN [id number]  PCP:  Clayborn Heron, MD  [facility] Cardiologist:  Yvonne Kendall, MD  [facility] Electrophysiologist:  None   Referring MD: Clayborn Heron, MD   Chief Complaint: 3 month follow-up  History of Present Illness:    Jason Suarez is a 67 y.o. male with a hx of CAD s/p PCI to the LAD 02/2019, HTN, HLD, and DM2 who presents for follow-up.   Patient was evaluated in 2020 in the setting of unstable angina with CT FFR in September 2020 revealing significant LAD disease.  He subsequently underwent diagnostic catheterization and PCI drug-eluting stent placement to the proximal and mid LAD.  MPI in 12/01/2021 for exertional dyspnea was low risk without evidence of ischemia or scar, LVEF was normal at 58%.  Patient was last seen December 2023 and was overall feeling good from a cardiac perspective.  Today, the patient reports an episode of chest pain 3 days ago while working. He was doing yard work and was pushing himself more than normal and had left sided chest tightness. He had associated shortness of breath. No nasuea or vomiting.  Patient rested and symptoms went away.  He did not take aspirin or nitro.  He reports pain is the same as prior to stenting.  No active chest pain today.  EKG with no ischemic changes.  Past Medical History:  Diagnosis Date   Anxiety    Back pain    CAD in native artery 03/03/2019   a. 02/2019 PCI: LM nl, LAD 70p/m, 50/23m (3.0x34 Resolute Onyx DES), D2 70, LCX 50p, RCA 20p/25m, EF 65%.   Dizziness    DM (diabetes mellitus)    Hyperlipidemia LDL goal <70    Hypertension    Light headedness    Low HDL (under 40)    Overweight    RBBB 03/03/2012   S/P angioplasty with stent and atherectomy to proximal/mid LAD with Resolute Onyx,  and PTCA to small D2 that was jailed 03/03/2019   Syncopal episodes     Past Surgical History:  Procedure  Laterality Date   CORONARY ATHERECTOMY N/A 03/02/2019   Procedure: CORONARY ATHERECTOMY;  Surgeon: Yvonne Kendall, MD;  [facility]: [facility];  Service: Cardiovascular;  Laterality: N/A;   CORONARY STENT INTERVENTION N/A 03/02/2019   Procedure: CORONARY STENT INTERVENTION;  Surgeon: Yvonne Kendall, MD;  [facility]: [facility];  Service: Cardiovascular;  Laterality: N/A;   CORONARY ULTRASOUND/IVUS N/A 03/02/2019   Procedure: Intravascular Ultrasound/IVUS;  Surgeon: Yvonne Kendall, MD;  [facility];  Service: Cardiovascular;  Laterality: N/A;   KNEE ARTHROSCOPY     left   LEFT HEART CATH AND CORONARY ANGIOGRAPHY N/A 03/02/2019   Procedure: LEFT HEART CATH AND CORONARY ANGIOGRAPHY;  Surgeon: Yvonne Kendall, MD;  [facility]: [facility];  Service: Cardiovascular;  Laterality: N/A;   SHOULDER SURGERY     right    SURGERY SCROTAL / TESTICULAR      Current Medications: Current Meds  Medication Sig   atorvastatin (LIPITOR) 20 MG tablet Take 1 tablet by mouth daily.   clopidogrel (PLAVIX) 75 MG tablet TAKE 1 TABLET BY MOUTH DAILY WITH BREAKFAST.   isosorbide mononitrate (IMDUR) 30 MG 24 hr tablet Take 0.5 tablets (15 mg total) by mouth daily.   losartan (COZAAR) 100 MG tablet Take 1 tablet (100 mg total) by mouth daily.   metFORMIN (GLUCOPHAGE) 500  MG tablet Take 500 mg by mouth daily with breakfast.   Misc Natural Products (TUMERSAID PO) Take by mouth. Eats 2 pieces of root every other day   Multiple Vitamins-Minerals (MULTIVITAMINS THER. W/MINERALS) TABS Take 1 tablet by mouth at bedtime.    nitroGLYCERIN (NITROSTAT) 0.4 MG SL tablet PLACE 1 TABLET (0.4 MG TOTAL) UNDER THE TONGUE EVERY 5 (FIVE) MINUTES AS NEEDED FOR CHEST PAIN.   Omega-3 Fatty Acids (FISH OIL) 1200 MG CAPS Take 2,400 mg by mouth at bedtime.     Allergies:   Lisinopril   Social History   Socioeconomic History   Marital status: Married    Spouse name: Not on file   Number of  children: Not on file   Years of education: Not on file   Highest education level: Not on file  Occupational History   Not on file  Tobacco Use   Smoking status: Never   Smokeless tobacco: Never  Vaping Use   Vaping Use: Never used  Substance and Sexual Activity   Alcohol use: Yes    Comment: beer on occassion   Drug use: Never   Sexual activity: Yes    Comment: MARRIED  Other Topics Concern   Not on file  Social History Narrative   Not on file   Social Determinants of Health   Financial Resource Strain: Not on file  Food Insecurity: Not on file  Transportation Needs: Not on file  Physical Activity: Not on file  Stress: Not on file  Social Connections: Not on file     Family History: The patient's family history includes Coronary artery disease in his father; Diabetes in his mother; Heart attack in his father; Heart disease in his brother; Hypertension in his father and mother.  ROS:   Please see the history of present illness.     All other systems reviewed and are negative.  EKGs/Labs/Other Studies Reviewed:    The following studies were reviewed today:  Myoview lexiscan 11/2021   The study is normal. The study is low risk.   LV perfusion is normal.   Left ventricular function is normal. Nuclear stress EF: 58 %. The left ventricular ejection fraction is normal (55-65%). End diastolic cavity size is normal. End systolic cavity size is normal.   Prior study available for comparison from 07/30/1998. No changes compared to prior study.   Normal perfusion. LVEF 58% with normal wall motion. This is a low risk study. No change compared to prior study from 2000.  Cardiac Cath 02/2019 Conclusions: Severe single-vessel coronary artery disease with sequential 70% and 50% proximal/mid LAD stenoses, previously shown to be hemodynamically significant by CT FFR. Moderate, non-obstructive coronary artery disease involving the LCx and RCA. Mildly elevated LVEDP with normal  LVEF. Successful IVUS-guided orbital atherectomy and PCI to the proximal/mid LAD using Resolute Onyx 3.0 x 34 mm drug-eluting stent with 0% residual stenosis and TIMI-3 flow. Small D2 lesion with 70% ostial stenosis was jailed by the stent with resultant loss of flow.  Occlusion was successfully treated with a 1.5 x 12 mm balloon, though a non-flow-limiting dissection occurred.  The vessel is too small for stent placement.   Recommendations: Overnight extended recovery. Dual antiplatelet therapy with aspirin and clopidogrel for at least 6 months, ideally longer if tolerated. Aggressive secondary prevention.   Yvonne Kendall, MD [facility] Pager: [phone]  EKG:  EKG is ordered today.  The ekg ordered today demonstrates sinus bradycardia, 55 bpm, sinus arrhythmia, incomplete right bundle branch block,  T wave inversions in lead III  Recent Labs: No results found for requested labs within last 365 days.  Recent Lipid Panel No results found for: "CHOL", "TRIG", "HDL", "CHOLHDL", "VLDL", "LDLCALC", "LDLDIRECT"  Physical Exam:    VS:  BP (!) 140/80 (BP Location: Left Arm, Patient Position: Sitting, Cuff Size: Normal)   Pulse (!) 55   Ht 5\' 5"  (1.651 m)   Wt 148 lb 3.2 oz (67.2 kg)   SpO2 100%   BMI 24.66 kg/m     Wt Readings from Last 3 Encounters:  09/28/22 148 lb 3.2 oz (67.2 kg)  05/27/22 147 lb (66.7 kg)  12/03/21 149 lb (67.6 kg)     GEN:  Well nourished, well developed in no acute distress HEENT: Normal NECK: No JVD; No carotid bruits LYMPHATICS: No lymphadenopathy CARDIAC: RRR, no murmurs, rubs, gallops RESPIRATORY:  Clear to auscultation without rales, wheezing or rhonchi  ABDOMEN: Soft, non-tender, non-distended MUSCULOSKELETAL:  No edema; No deformity  SKIN: Warm and dry NEUROLOGIC:  Alert and oriented x 3 PSYCHIATRIC:  Normal affect   ASSESSMENT:    1. Coronary artery disease of native artery of native heart with stable angina pectoris   2. Essential  hypertension   3. Hyperlipidemia, mixed   4. RBBB    PLAN:    In order of problems listed above:  Chest pain CAD s/p PCI to the LAD 02/2019 Patient reports episode of angina while doing yard work, he was pushing himself more than normal.  He reports this was similar to prior stenting. No further episodes. Patient walks for formal exercise and generally has no chest pain.  MPI in June 2023 was normal. Long discussion regarding treatment options.  We will start Imdur 15 mg daily and see him back in a month to assess symptoms.  Continue Plavix 75 mg daily, Lipitor 20 mg daily, and SL NTG.  He was not on a beta-blocker due to bradycardia.  HTN BP mildly elevated today.  Start Imdur 15 mg as above.  Continue losartan 100 mg daily.  HLD Most recent LDL 56.  Continue Lipitor 20 mg daily.   Disposition: Follow up in 1 month(s) with MD/APP    Signed, Maximiano Lott David Stall, PA-C  09/28/2022 8:59 AM     [facility]

## 2022-09-28 ENCOUNTER — Encounter: Payer: Self-pay | Admitting: Medical

## 2022-09-28 ENCOUNTER — Ambulatory Visit: Payer: Medicare HMO | Attending: Medical | Admitting: Medical

## 2022-09-28 VITALS — BP 140/80 | HR 55 | Ht 65.0 in | Wt 148.2 lb

## 2022-09-28 DIAGNOSIS — I25118 Atherosclerotic heart disease of native coronary artery with other forms of angina pectoris: Secondary | ICD-10-CM | POA: Diagnosis not present

## 2022-09-28 DIAGNOSIS — I10 Essential (primary) hypertension: Secondary | ICD-10-CM

## 2022-09-28 DIAGNOSIS — E782 Mixed hyperlipidemia: Secondary | ICD-10-CM

## 2022-09-28 DIAGNOSIS — I4510 Unspecified right bundle-branch block: Secondary | ICD-10-CM

## 2022-09-28 MED ORDER — ISOSORBIDE MONONITRATE ER 30 MG PO TB24: 15.0000 mg | ORAL_TABLET | Freq: Every day | ORAL | 3 refills | Status: DC

## 2022-09-28 NOTE — Patient Instructions (Signed)
Medication Instructions:  Your physician has recommended you make the following change in your medication:   START - isosorbide mononitrate (IMDUR) 30 MG 24 hr tablet - Take 1/2 tablet (15 mg) by mouth daily  *If you need a refill on your cardiac medications before your next appointment, please call your pharmacy*  Lab Work: -None ordered If you have labs (blood work) drawn today and your tests are completely normal, you will receive your results only by: MyChart Message (if you have MyChart) OR A paper copy in the mail If you have any lab test that is abnormal or we need to change your treatment, we will call you to review the results.   Testing/Procedures: -None ordered  Follow-Up: At [facility], you and your health needs are our priority.  As part of our continuing mission to provide you with exceptional heart care, we have created designated Provider Care Teams.  These Care Teams include your primary Cardiologist (physician) and Advanced Practice Providers (APPs -  Physician Assistants and Nurse Practitioners) who all work together to provide you with the care you need, when you need it.  We recommend signing up for the patient portal called "MyChart".  Sign up information is provided on this After Visit Summary.  MyChart is used to connect with patients for Virtual Visits (Telemedicine).  Patients are able to view lab/test results, encounter notes, upcoming appointments, etc.  Non-urgent messages can be sent to your provider as well.   To learn more about what you can do with MyChart, go to [email or web address].    Your next appointment:   1 month(s)  Provider:   You may see Yvonne Kendall, MD or one of the following Advanced Practice Providers on your designated Care Team:   Nicolasa Ducking, NP Eula Listen, PA-C Cadence Fransico Michael, PA-C Charlsie Quest, NP  Other Instructions -None

## 2022-09-29 ENCOUNTER — Encounter: Payer: Self-pay | Admitting: Internal Medicine

## 2022-10-07 DIAGNOSIS — H524 Presbyopia: Secondary | ICD-10-CM | POA: Diagnosis not present

## 2022-10-11 DIAGNOSIS — H9202 Otalgia, left ear: Secondary | ICD-10-CM | POA: Diagnosis not present

## 2022-11-15 DIAGNOSIS — J069 Acute upper respiratory infection, unspecified: Secondary | ICD-10-CM | POA: Diagnosis not present

## 2022-11-15 DIAGNOSIS — I700 Atherosclerosis of aorta: Secondary | ICD-10-CM | POA: Diagnosis not present

## 2022-11-15 DIAGNOSIS — E7800 Pure hypercholesterolemia, unspecified: Secondary | ICD-10-CM | POA: Diagnosis not present

## 2022-11-15 DIAGNOSIS — E1151 Type 2 diabetes mellitus with diabetic peripheral angiopathy without gangrene: Secondary | ICD-10-CM | POA: Diagnosis not present

## 2022-11-15 DIAGNOSIS — I25118 Atherosclerotic heart disease of native coronary artery with other forms of angina pectoris: Secondary | ICD-10-CM | POA: Diagnosis not present

## 2022-11-15 DIAGNOSIS — E1159 Type 2 diabetes mellitus with other circulatory complications: Secondary | ICD-10-CM | POA: Diagnosis not present

## 2022-11-15 DIAGNOSIS — I10 Essential (primary) hypertension: Secondary | ICD-10-CM | POA: Diagnosis not present

## 2022-12-02 ENCOUNTER — Encounter: Payer: Self-pay | Admitting: Internal Medicine

## 2022-12-02 ENCOUNTER — Ambulatory Visit: Payer: Medicare HMO | Attending: Internal Medicine | Admitting: Internal Medicine

## 2022-12-02 VITALS — BP 140/90 | HR 51 | Ht 65.5 in | Wt 147.0 lb

## 2022-12-02 DIAGNOSIS — I25118 Atherosclerotic heart disease of native coronary artery with other forms of angina pectoris: Secondary | ICD-10-CM

## 2022-12-02 DIAGNOSIS — E1169 Type 2 diabetes mellitus with other specified complication: Secondary | ICD-10-CM

## 2022-12-02 DIAGNOSIS — I10 Essential (primary) hypertension: Secondary | ICD-10-CM | POA: Diagnosis not present

## 2022-12-02 DIAGNOSIS — Z7984 Long term (current) use of oral hypoglycemic drugs: Secondary | ICD-10-CM

## 2022-12-02 DIAGNOSIS — E785 Hyperlipidemia, unspecified: Secondary | ICD-10-CM | POA: Diagnosis not present

## 2022-12-02 MED ORDER — NITROGLYCERIN 0.4 MG SL SUBL: 0.4000 mg | SUBLINGUAL_TABLET | SUBLINGUAL | 3 refills | Status: AC | PRN

## 2022-12-02 MED ORDER — ISOSORBIDE MONONITRATE ER 30 MG PO TB24: 15.0000 mg | ORAL_TABLET | Freq: Every day | ORAL | 3 refills | Status: DC

## 2022-12-02 NOTE — Patient Instructions (Signed)
Medication Instructions:  Your physician recommends that you continue on your current medications as directed. Please refer to the Current Medication list given to you today.   *If you need a refill on your cardiac medications before your next appointment, please call your pharmacy*   Lab Work: None ordered today   Testing/Procedures: None ordered today   Follow-Up: At [facility], you and your health needs are our priority.  As part of our continuing mission to provide you with exceptional heart care, we have created designated Provider Care Teams.  These Care Teams include your primary Cardiologist (physician) and Advanced Practice Providers (APPs -  Physician Assistants and Nurse Practitioners) who all work together to provide you with the care you need, when you need it.  We recommend signing up for the patient portal called "MyChart".  Sign up information is provided on this After Visit Summary.  MyChart is used to connect with patients for Virtual Visits (Telemedicine).  Patients are able to view lab/test results, encounter notes, upcoming appointments, etc.  Non-urgent messages can be sent to your provider as well.   To learn more about what you can do with MyChart, go to [email or web address].    Your next appointment:   6 month(s)  Provider:   You may see Yvonne Kendall, MD or one of the following Advanced Practice Providers on your designated Care Team:   Nicolasa Ducking, NP Eula Listen, PA-C Cadence Fransico Michael, PA-C Charlsie Quest, NP

## 2022-12-02 NOTE — Progress Notes (Signed)
Follow-up Outpatient Visit Date: 12/02/2022  Primary Care Provider: Clayborn Heron, MD [address] [place] [address]  Chief Complaint: Follow-up coronary artery disease  HPI:  Jason Suarez is a 67 y.o. male with history of coronary artery disease status post PCI to the LAD in 02/2019, hypertension, hyperlipidemia, and diabetes mellitus, who presents for follow-up of coronary artery disease.  I last saw Mr. Jason Suarez in 05/2022, at which time he was feeling well.  He reached out to our office in April complaining of an isolated episode of chest pain with activity.  He was subsequently seen in the office by Cadence [place], [place], who noted that this was an isolated episode.  He was started on isosorbide mononitrate 15 mg daily.  Further testing was deferred.  Today, Mr. Jason Suarez reports that he is feeling better without any recurrent chest pain.  He also denies shortness of breath, palpitations, lightheadedness, and edema.  He had some mild headaches when he first began isosorbide mononitrate, though these has since resolved.  He is checking his blood pressure intermittently at home and notes that it is typically higher in the mornings with readings sometimes in the 130-140 mmHg systolic range.  He has had some readings later in the day as low as 100 mmHg systolic.  He is planning to travel to [place] for a month next week.  --------------------------------------------------------------------------------------------------  Past Medical History:  Diagnosis Date   Anxiety    Back pain    CAD in native artery 03/03/2019   a. 02/2019 PCI: LM nl, LAD 70p/m, 50/43m (3.0x34 Resolute Onyx DES), D2 70, LCX 50p, RCA 20p/75m, EF 65%.   Dizziness    DM (diabetes mellitus) (HCC)    Hyperlipidemia LDL goal <70    Hypertension    Light headedness    Low HDL (under 40)    Overweight    RBBB 03/03/2012   S/P angioplasty with stent and atherectomy to proximal/mid LAD with Resolute Onyx,  and PTCA to small  D2 that was jailed 03/03/2019   Syncopal episodes    Past Surgical History:  Procedure Laterality Date   CORONARY ATHERECTOMY N/A 03/02/2019   Procedure: CORONARY ATHERECTOMY;  Surgeon: Yvonne Kendall, MD;  Location: [facility];  Service: Cardiovascular;  Laterality: N/A;   CORONARY STENT INTERVENTION N/A 03/02/2019   Procedure: CORONARY STENT INTERVENTION;  Surgeon: Yvonne Kendall, MD;  Location: [facility];  Service: Cardiovascular;  Laterality: N/A;   CORONARY ULTRASOUND/IVUS N/A 03/02/2019   Procedure: Intravascular Ultrasound/IVUS;  Surgeon: Yvonne Kendall, MD;  Location: [facility];  Service: Cardiovascular;  Laterality: N/A;   KNEE ARTHROSCOPY     left   LEFT HEART CATH AND CORONARY ANGIOGRAPHY N/A 03/02/2019   Procedure: LEFT HEART CATH AND CORONARY ANGIOGRAPHY;  Surgeon: Yvonne Kendall, MD;  Location: [facility];  Service: Cardiovascular;  Laterality: N/A;   SHOULDER SURGERY     right    SURGERY SCROTAL / TESTICULAR      Current Meds  Medication Sig   atorvastatin (LIPITOR) 20 MG tablet Take 1 tablet by mouth daily.   clopidogrel (PLAVIX) 75 MG tablet TAKE 1 TABLET BY MOUTH DAILY WITH BREAKFAST.   isosorbide mononitrate (IMDUR) 30 MG 24 hr tablet Take 0.5 tablets (15 mg total) by mouth daily.   losartan (COZAAR) 100 MG tablet Take 1 tablet (100 mg total) by mouth daily.   metFORMIN (GLUCOPHAGE) 500 MG tablet Take 500 mg by mouth daily with breakfast.  Misc Natural Products (TUMERSAID PO) Take by mouth. Eats 2 pieces of root every other day   Multiple Vitamins-Minerals (MULTIVITAMINS THER. W/MINERALS) TABS Take 1 tablet by mouth at bedtime.    nitroGLYCERIN (NITROSTAT) 0.4 MG SL tablet PLACE 1 TABLET (0.4 MG TOTAL) UNDER THE TONGUE EVERY 5 (FIVE) MINUTES AS NEEDED FOR CHEST PAIN.   Omega-3 Fatty Acids (FISH OIL) 1200 MG CAPS Take 2,400 mg by mouth at bedtime.    Allergies: Lisinopril  Social History   Tobacco Use   Smoking status:  Never   Smokeless tobacco: Never  Vaping Use   Vaping Use: Never used  Substance Use Topics   Alcohol use: Yes    Comment: beer on occassion   Drug use: Never    Family History  Problem Relation Age of Onset   Diabetes Mother    Hypertension Mother    Heart attack Father        had stent    Coronary artery disease Father    Hypertension Father    Heart disease Brother        CABG AT 75    Review of Systems: A 12-system review of systems was performed and was negative except as noted in the HPI.  --------------------------------------------------------------------------------------------------  Physical Exam: BP (!) 140/90 (BP Location: Left Arm, Patient Position: Sitting, Cuff Size: Normal)   Pulse (!) 51   Ht 5' 5.5" (1.664 m)   Wt 147 lb (66.7 kg)   SpO2 99%   BMI 24.09 kg/m   General:  NAD. Neck: No JVD or HJR. Lungs: Clear to auscultation bilaterally without wheezes or crackles. Heart: Bradycardic but regular without murmurs, rubs, or gallops. Abdomen: Soft, nontender, nondistended. Extremities: No lower extremity edema.  EKG (09/28/2022): Sinus bradycardia with incomplete right bundle branch block.   Outside labs (11/15/2022): CMP: Sodium 140, potassium 4.5, chloride 106, CO2 29, BUN 13, creatinine 0.8, glucose 126, calcium 9.9, AST 18, ALT 14, alkaline phosphatase 71, total bilirubin 0.5, total protein 7.0, albumin 4.4  Lipid panel: Total cholesterol 94, triglycerides 75, HDL 33, LDL 45  Hemoglobin A1c: 6.5%  --------------------------------------------------------------------------------------------------  ASSESSMENT AND PLAN: Coronary artery disease with stable angina: Mr. Jason Suarez has not had any further chest pain since addition of isosorbide mononitrate.  He remains active without limitations.  Myocardial perfusion stress test last year was without ischemia or scar.  We will defer additional testing and continue isosorbide mononitrate for antianginal  therapy.  Continue secondary prevention with clopidogrel and atorvastatin.  LDL very well-controlled on the labs earlier this month through his PCP.  Hypertension: Blood pressure mildly elevated today, typically better at home but still somewhat labile.  Given that Mr. Steidinger has occasional systolic readings to the low 100s, we will defer medication changes today.  I have asked him to monitor his blood pressure more regularly at home and to alert [place] if it is consistently above 130/80.  Hyperlipidemia associated with type 2 diabetes mellitus: Lipids very well-controlled on labs earlier this month through his PCPs office.  Hemoglobin A1c also well-controlled.  Continue atorvastatin 20 mg daily and metformin.  Follow-up: Return to clinic in 6 months.  Yvonne Kendall, MD 12/02/2022 8:15 AM

## 2023-01-07 ENCOUNTER — Encounter: Payer: Self-pay | Admitting: Internal Medicine

## 2023-01-23 DIAGNOSIS — S90861A Insect bite (nonvenomous), right foot, initial encounter: Secondary | ICD-10-CM | POA: Diagnosis not present

## 2023-01-24 DIAGNOSIS — D649 Anemia, unspecified: Secondary | ICD-10-CM | POA: Diagnosis not present

## 2023-01-24 DIAGNOSIS — R1033 Periumbilical pain: Secondary | ICD-10-CM | POA: Diagnosis not present

## 2023-01-24 DIAGNOSIS — Z0000 Encounter for general adult medical examination without abnormal findings: Secondary | ICD-10-CM | POA: Diagnosis not present

## 2023-03-14 DIAGNOSIS — Z23 Encounter for immunization: Secondary | ICD-10-CM | POA: Diagnosis not present

## 2023-03-23 DIAGNOSIS — R350 Frequency of micturition: Secondary | ICD-10-CM | POA: Diagnosis not present

## 2023-03-23 DIAGNOSIS — M1712 Unilateral primary osteoarthritis, left knee: Secondary | ICD-10-CM | POA: Diagnosis not present

## 2023-03-23 DIAGNOSIS — M94262 Chondromalacia, left knee: Secondary | ICD-10-CM | POA: Diagnosis not present

## 2023-03-23 DIAGNOSIS — R351 Nocturia: Secondary | ICD-10-CM | POA: Diagnosis not present

## 2023-03-23 DIAGNOSIS — E291 Testicular hypofunction: Secondary | ICD-10-CM | POA: Diagnosis not present

## 2023-03-23 DIAGNOSIS — E119 Type 2 diabetes mellitus without complications: Secondary | ICD-10-CM | POA: Diagnosis not present

## 2023-03-23 DIAGNOSIS — R52 Pain, unspecified: Secondary | ICD-10-CM | POA: Diagnosis not present

## 2023-04-27 DIAGNOSIS — M1712 Unilateral primary osteoarthritis, left knee: Secondary | ICD-10-CM | POA: Diagnosis not present

## 2023-05-04 ENCOUNTER — Encounter: Payer: Self-pay | Admitting: Internal Medicine

## 2023-05-04 ENCOUNTER — Ambulatory Visit: Payer: Medicare HMO | Attending: Internal Medicine | Admitting: Internal Medicine

## 2023-05-04 VITALS — BP 120/76 | HR 50 | Ht 65.0 in | Wt 147.2 lb

## 2023-05-04 DIAGNOSIS — I10 Essential (primary) hypertension: Secondary | ICD-10-CM

## 2023-05-04 DIAGNOSIS — I25118 Atherosclerotic heart disease of native coronary artery with other forms of angina pectoris: Secondary | ICD-10-CM | POA: Diagnosis not present

## 2023-05-04 DIAGNOSIS — E785 Hyperlipidemia, unspecified: Secondary | ICD-10-CM

## 2023-05-04 DIAGNOSIS — R001 Bradycardia, unspecified: Secondary | ICD-10-CM | POA: Diagnosis not present

## 2023-05-04 DIAGNOSIS — E1169 Type 2 diabetes mellitus with other specified complication: Secondary | ICD-10-CM | POA: Diagnosis not present

## 2023-05-04 DIAGNOSIS — I4510 Unspecified right bundle-branch block: Secondary | ICD-10-CM | POA: Diagnosis not present

## 2023-05-04 NOTE — Patient Instructions (Signed)
 Medication Instructions:  Your physician recommends that you continue on your current medications as directed. Please refer to the Current Medication list given to you today.   *If you need a refill on your cardiac medications before your next appointment, please call your pharmacy*   Lab Work: No labs ordered today    Testing/Procedures: No test ordered today    Follow-Up: At [facility], you and your health needs are our priority.  As part of our continuing mission to provide you with exceptional heart care, we have created designated Provider Care Teams.  These Care Teams include your primary Cardiologist (physician) and Advanced Practice Providers (APPs -  Physician Assistants and Nurse Practitioners) who all work together to provide you with the care you need, when you need it.  We recommend signing up for the patient portal called "MyChart".  Sign up information is provided on this After Visit Summary.  MyChart is used to connect with patients for Virtual Visits (Telemedicine).  Patients are able to view lab/test results, encounter notes, upcoming appointments, etc.  Non-urgent messages can be sent to your provider as well.   To learn more about what you can do with MyChart, go to [email or web address].    Your next appointment:   1 year(s)  Provider:   You may see Yvonne Kendall, MD or one of the following Advanced Practice Providers on your designated Care Team:   Nicolasa Ducking, NP Eula Listen, PA-C Cadence Fransico Michael, PA-C Charlsie Quest, NP Carlos Levering, NP

## 2023-05-04 NOTE — Progress Notes (Signed)
Cardiology Office Note:  .   Date:  05/04/2023  ID:  Jason Suarez, DOB 07/22/1955, MRN [id number] PCP: Joycelyn Rua, MD  [facility] Providers Cardiologist:  Yvonne Kendall, MD     History of Present Illness: .   Jason Suarez is a 67 y.o. male with history of coronary artery disease status post PCI to the LAD in 02/2019, hypertension, hyperlipidemia, and diabetes mellitus, who presents for follow-up of coronary artery disease.  I last saw him in June at which time he was feeling well without any recurrent chest pain after having been started on low-dose isosorbide mononitrate a few months earlier.  Preceding MPI in 11/2021 was low risk without evidence of ischemia or scar.  We did not make any medication changes or pursue additional testing.  Today, Jason Suarez reports that he has been feeling well without chest pain, shortness of breath, palpitations, lightheadedness, edema, headaches, or bleeding.  He notes that his blood pressures are sometimes elevated, up to 150/90, in the morning.  However in the afternoon, his blood pressure is typically lower, with systolic readings sometimes down into the 90s.  He is asymptomatic both with elevated and borderline low blood pressures.  He continues to walk regularly though he has been on a bit of a hiatus the last couple of weeks due to travels.  ROS: See HPI  Studies Reviewed: Marland Kitchen   EKG Interpretation Date/Time:  Wednesday May 04 2023 08:37:03 EST Ventricular Rate:  50 PR Interval:  150 QRS Duration:  138 QT Interval:  444 QTC Calculation: 404 R Axis:   6  Text Interpretation: Sinus bradycardia Right bundle branch block When compared with ECG of 28-Sep-2022 Right bundle branch block has replaced Incomplete right bundle branch block Otherwise no significant change Confirmed by Kelcey Korus, Cristal Deer [id number]) on 05/04/2023 9:00:20 AM    MPI (12/03/2021): Low risk study without evidence of ischemia or scar.  LVEF 58%.  Risk  Assessment/Calculations:             Physical Exam:   VS:  BP 120/76   Pulse (!) 50   Ht 5\' 5"  (1.651 m)   Wt 147 lb 3.2 oz (66.8 kg)   SpO2 98%   BMI 24.50 kg/m    Wt Readings from Last 3 Encounters:  05/04/23 147 lb 3.2 oz (66.8 kg)  12/02/22 147 lb (66.7 kg)  09/28/22 148 lb 3.2 oz (67.2 kg)    General:  NAD. Neck: No JVD or HJR. Lungs: Clear to auscultation bilaterally without wheezes or crackles. Heart: Bradycardic but regular without murmurs, rubs, or gallops. Abdomen: Soft, nontender, nondistended. Extremities: No lower extremity edema.  ASSESSMENT AND PLAN: .    Coronary artery disease with stable angina: Jason Suarez has not had any further chest pain and is tolerating low-dose isosorbide mononitrate well.  Will continue this for antianginal therapy.  Continue clopidogrel and atorvastatin for secondary prevention as well.  Hyperlipidemia associated with type 2 diabetes mellitus: LDL very well-controlled on last check through PCP in June.  Continue atorvastatin 20 mg daily.  Ongoing management of DM per PCP.  Hypertension: Blood pressure initially mildly elevated with diastolic reading of 84.  This is normal on recheck at 120/76.  Jason Suarez notes some labile blood pressures at home, particularly in the mornings when it is sometimes elevated up to 150/90.  He will try changing the timing of his losartan, which he currently takes in the evenings.  I have asked him to continue monitoring his blood pressure  and to alert [place] and his PCP if he continues to have morning spikes.  We will defer changes to his current regimen of losartan and isosorbide mononitrate.  He is limiting his sodium intake, which I encouraged him to continue with.  Sinus bradycardia and right bundle branch block: EKG today again shows sinus bradycardia, asymptomatic.  Right bundle branch block has replaced incomplete right bundle branch block since April, though tracing is very similar to prior 1 from 05/2022.   No symptoms of high-grade AV block reported.  Continue clinical follow-up with avoidance of rate controlling/AV nodal blocking agents.    Dispo: Return to clinic in 1 year.  Signed, Yvonne Kendall, MD

## 2023-05-31 DIAGNOSIS — H524 Presbyopia: Secondary | ICD-10-CM | POA: Diagnosis not present

## 2023-05-31 DIAGNOSIS — H2513 Age-related nuclear cataract, bilateral: Secondary | ICD-10-CM | POA: Diagnosis not present

## 2023-05-31 DIAGNOSIS — E119 Type 2 diabetes mellitus without complications: Secondary | ICD-10-CM | POA: Diagnosis not present

## 2023-06-01 DIAGNOSIS — M1712 Unilateral primary osteoarthritis, left knee: Secondary | ICD-10-CM | POA: Diagnosis not present

## 2023-07-05 DIAGNOSIS — M1712 Unilateral primary osteoarthritis, left knee: Secondary | ICD-10-CM | POA: Diagnosis not present

## 2023-07-05 DIAGNOSIS — M47816 Spondylosis without myelopathy or radiculopathy, lumbar region: Secondary | ICD-10-CM | POA: Diagnosis not present

## 2023-07-28 DIAGNOSIS — J069 Acute upper respiratory infection, unspecified: Secondary | ICD-10-CM | POA: Diagnosis not present

## 2023-08-01 DIAGNOSIS — M1732 Unilateral post-traumatic osteoarthritis, left knee: Secondary | ICD-10-CM | POA: Diagnosis not present

## 2023-08-01 DIAGNOSIS — M5416 Radiculopathy, lumbar region: Secondary | ICD-10-CM | POA: Diagnosis not present

## 2023-08-09 DIAGNOSIS — M1712 Unilateral primary osteoarthritis, left knee: Secondary | ICD-10-CM | POA: Diagnosis not present

## 2023-08-15 ENCOUNTER — Telehealth: Payer: Self-pay | Admitting: Internal Medicine

## 2023-08-15 MED ORDER — CLOPIDOGREL BISULFATE 75 MG PO TABS: 75.0000 mg | ORAL_TABLET | Freq: Every day | ORAL | 3 refills | Status: DC

## 2023-08-15 NOTE — Telephone Encounter (Signed)
*  STAT* If patient is at the pharmacy, call can be transferred to refill team.   1. Which medications need to be refilled? (please list name of each medication and dose if known)   clopidogrel (PLAVIX) 75 MG tablet    4. Which pharmacy/location (including street and city if local pharmacy) is medication to be sent to? [id number] - [place], [place] - [address] AT [facility] [address] Phone: [phone]  Fax: [phone]       5. Do they need a 30 day or 90 day supply? 90    Pt completely out

## 2023-08-15 NOTE — Telephone Encounter (Signed)
 Requested Prescriptions   Pending Prescriptions Disp Refills   clopidogrel (PLAVIX) 75 MG tablet 90 tablet 3    Sig: Take 1 tablet (75 mg total) by mouth daily with breakfast.

## 2023-08-19 DIAGNOSIS — M1712 Unilateral primary osteoarthritis, left knee: Secondary | ICD-10-CM | POA: Diagnosis not present

## 2023-09-19 DIAGNOSIS — M1712 Unilateral primary osteoarthritis, left knee: Secondary | ICD-10-CM | POA: Diagnosis not present

## 2023-10-20 DIAGNOSIS — K080 Exfoliation of teeth due to systemic causes: Secondary | ICD-10-CM | POA: Diagnosis not present

## 2023-10-31 DIAGNOSIS — E7800 Pure hypercholesterolemia, unspecified: Secondary | ICD-10-CM | POA: Diagnosis not present

## 2023-10-31 DIAGNOSIS — Z1329 Encounter for screening for other suspected endocrine disorder: Secondary | ICD-10-CM | POA: Diagnosis not present

## 2023-10-31 DIAGNOSIS — I25118 Atherosclerotic heart disease of native coronary artery with other forms of angina pectoris: Secondary | ICD-10-CM | POA: Diagnosis not present

## 2023-10-31 DIAGNOSIS — E1159 Type 2 diabetes mellitus with other circulatory complications: Secondary | ICD-10-CM | POA: Diagnosis not present

## 2023-10-31 DIAGNOSIS — I10 Essential (primary) hypertension: Secondary | ICD-10-CM | POA: Diagnosis not present

## 2023-12-14 DIAGNOSIS — M1712 Unilateral primary osteoarthritis, left knee: Secondary | ICD-10-CM | POA: Diagnosis not present

## 2023-12-22 DIAGNOSIS — M25562 Pain in left knee: Secondary | ICD-10-CM | POA: Diagnosis not present

## 2023-12-30 ENCOUNTER — Other Ambulatory Visit: Payer: Self-pay

## 2023-12-30 MED ORDER — ISOSORBIDE MONONITRATE ER 30 MG PO TB24: 15.0000 mg | ORAL_TABLET | Freq: Every day | ORAL | 1 refills | Status: DC

## 2024-01-06 DIAGNOSIS — M1712 Unilateral primary osteoarthritis, left knee: Secondary | ICD-10-CM | POA: Diagnosis not present

## 2024-02-20 DIAGNOSIS — K080 Exfoliation of teeth due to systemic causes: Secondary | ICD-10-CM | POA: Diagnosis not present

## 2024-02-28 DIAGNOSIS — M545 Low back pain, unspecified: Secondary | ICD-10-CM | POA: Diagnosis not present

## 2024-02-28 DIAGNOSIS — Z23 Encounter for immunization: Secondary | ICD-10-CM | POA: Diagnosis not present

## 2024-03-12 DIAGNOSIS — M5432 Sciatica, left side: Secondary | ICD-10-CM | POA: Diagnosis not present

## 2024-03-12 DIAGNOSIS — M1712 Unilateral primary osteoarthritis, left knee: Secondary | ICD-10-CM | POA: Diagnosis not present

## 2024-04-08 ENCOUNTER — Other Ambulatory Visit: Payer: Self-pay | Admitting: Internal Medicine

## 2024-04-09 DIAGNOSIS — M1712 Unilateral primary osteoarthritis, left knee: Secondary | ICD-10-CM | POA: Diagnosis not present

## 2024-04-25 DIAGNOSIS — Z1331 Encounter for screening for depression: Secondary | ICD-10-CM | POA: Diagnosis not present

## 2024-04-25 DIAGNOSIS — E538 Deficiency of other specified B group vitamins: Secondary | ICD-10-CM | POA: Diagnosis not present

## 2024-04-25 DIAGNOSIS — E038 Other specified hypothyroidism: Secondary | ICD-10-CM | POA: Diagnosis not present

## 2024-04-25 DIAGNOSIS — I10 Essential (primary) hypertension: Secondary | ICD-10-CM | POA: Diagnosis not present

## 2024-04-25 DIAGNOSIS — E1159 Type 2 diabetes mellitus with other circulatory complications: Secondary | ICD-10-CM | POA: Diagnosis not present

## 2024-04-25 DIAGNOSIS — Z0000 Encounter for general adult medical examination without abnormal findings: Secondary | ICD-10-CM | POA: Diagnosis not present

## 2024-04-25 DIAGNOSIS — I25118 Atherosclerotic heart disease of native coronary artery with other forms of angina pectoris: Secondary | ICD-10-CM | POA: Diagnosis not present

## 2024-04-25 DIAGNOSIS — E7800 Pure hypercholesterolemia, unspecified: Secondary | ICD-10-CM | POA: Diagnosis not present

## 2024-05-04 ENCOUNTER — Encounter: Payer: Self-pay | Admitting: Internal Medicine

## 2024-05-04 ENCOUNTER — Ambulatory Visit: Attending: Internal Medicine | Admitting: Internal Medicine

## 2024-05-04 VITALS — BP 122/80 | HR 57 | Ht 65.0 in | Wt 142.5 lb

## 2024-05-04 DIAGNOSIS — I4510 Unspecified right bundle-branch block: Secondary | ICD-10-CM

## 2024-05-04 DIAGNOSIS — I10 Essential (primary) hypertension: Secondary | ICD-10-CM

## 2024-05-04 DIAGNOSIS — R001 Bradycardia, unspecified: Secondary | ICD-10-CM

## 2024-05-04 DIAGNOSIS — E785 Hyperlipidemia, unspecified: Secondary | ICD-10-CM

## 2024-05-04 DIAGNOSIS — H93A9 Pulsatile tinnitus, unspecified ear: Secondary | ICD-10-CM | POA: Insufficient documentation

## 2024-05-04 DIAGNOSIS — E1169 Type 2 diabetes mellitus with other specified complication: Secondary | ICD-10-CM

## 2024-05-04 DIAGNOSIS — I25118 Atherosclerotic heart disease of native coronary artery with other forms of angina pectoris: Secondary | ICD-10-CM

## 2024-05-04 MED ORDER — ISOSORBIDE MONONITRATE ER 30 MG PO TB24: 15.0000 mg | ORAL_TABLET | Freq: Every day | ORAL | 3 refills | Status: AC

## 2024-05-04 MED ORDER — LOSARTAN POTASSIUM 100 MG PO TABS: 100.0000 mg | ORAL_TABLET | Freq: Every day | ORAL | 3 refills | Status: AC

## 2024-05-04 MED ORDER — CLOPIDOGREL BISULFATE 75 MG PO TABS: 75.0000 mg | ORAL_TABLET | Freq: Every day | ORAL | 3 refills | Status: AC

## 2024-05-04 NOTE — Patient Instructions (Signed)
 Medication Instructions:  Your physician recommends that you continue on your current medications as directed. Please refer to the Current Medication list given to you today.  *If you need a refill on your cardiac medications before your next appointment, please call your pharmacy*  Lab Work: No labs today. If you have labs (blood work) drawn today and your tests are completely normal, you will receive your results only by: MyChart Message (if you have MyChart) OR A paper copy in the mail If you have any lab test that is abnormal or we need to change your treatment, we will call you to review the results.  Testing/Procedures: Your physician has requested that you have a carotid duplex. This test is an ultrasound of the carotid arteries in your neck. It looks at blood flow through these arteries that supply the brain with blood.   Allow one hour for this exam.  There are no restrictions or special instructions.   Please note: We ask at that you not bring children with you during ultrasound (echo/ vascular) testing. Due to room size and safety concerns, children are not allowed in the ultrasound rooms during exams. Our front office staff cannot provide observation of children in our lobby area while testing is being conducted. An adult accompanying a patient to their appointment will only be allowed in the ultrasound room at the discretion of the ultrasound technician under special circumstances. We apologize for any inconvenience.   Follow-Up: At [facility], you and your health needs are our priority.  As part of our continuing mission to provide you with exceptional heart care, our providers are all part of one team.  This team includes your primary Cardiologist (physician) and Advanced Practice Providers or APPs (Physician Assistants and Nurse Practitioners) who all work together to provide you with the care you need, when you need it.  Your next appointment:   1  year(s)  Provider:   Lonni Hanson, MD    We recommend signing up for the patient portal called MyChart.  Sign up information is provided on this After Visit Summary.  MyChart is used to connect with patients for Virtual Visits (Telemedicine).  Patients are able to view lab/test results, encounter notes, upcoming appointments, etc.  Non-urgent messages can be sent to your provider as well.   To learn more about what you can do with MyChart, go to [email or web address].

## 2024-05-04 NOTE — Progress Notes (Signed)
 Cardiology Office Note:  .   Date:  05/04/2024  ID:  Jason Suarez, DOB 01-24-1956, MRN [id number] PCP: Nanci Senior, MD  [facility] Providers Cardiologist:  Lonni Hanson, MD     History of Present Illness: .   Jason Suarez is a 68 y.o. male with history of coronary artery disease status post PCI to the LAD in 02/2019, hypertension, hyperlipidemia, and diabetes mellitus, who presents for follow-up of coronary artery disease.  I last saw him a year ago, at which time he was feeling well.  He noted that his morning blood pressures were sometimes elevated, particularly in the mornings, followed by borderline hypotension later in the day.  BP was normal at the time of our visit.  We did not make any medication changes or pursue additional testing.  Today, Jason Suarez reports that he has been feeling fairly well.  His only complaint is of intermittent pain involving the left ear and eye.  It sometimes has a pulsatile component to it and may accompany some of his activities.  He notes a remote eye injury and wonders if this could be contributing.  He follows regularly with an ophthalmologist.  He has not had any associated chest pain or dyspnea.  He also denies chest pain and shortness of breath at other times.  He continues to walk regularly without difficulty.  Jason Suarez's other concern is that his resting heart rate intermittently drops as low as the upper 40s.  He is asymptomatic and notes that his heart rate goes up appropriately when he walks.  He has not had any lightheadedness or syncope nor palpitations.  He is tolerating his medications well.  Blood pressure is typically well-controlled though occasionally the systolic pressure will be at or just above 140 mmHg.  Jason Suarez recently had labs checked by his PCP, which were notable for total cholesterol 105, triglycerides 57, HDL 40, LDL 52, and hemoglobin A1c 7.1.  ROS: See HPI  Studies Reviewed: SABRA   EKG  Interpretation Date/Time:  Friday May 04 2024 08:03:14 EST Ventricular Rate:  57 PR Interval:  156 QRS Duration:  136 QT Interval:  432 QTC Calculation: 420 R Axis:   13  Text Interpretation: Sinus bradycardia Right bundle branch block Abnormal ECG When compared with ECG of 04-May-2023 08:37, No significant change was found Confirmed by Vi Whitesel [phone]) on 05/04/2024 8:06:17 AM    MPI (12/03/2021): Low risk study without evidence of ischemia or scar. LVEF 58%.  Risk Assessment/Calculations:           Physical Exam:   VS:  BP 122/80 (BP Location: Left Arm, Patient Position: Sitting, Cuff Size: Normal)   Pulse (!) 57   Ht 5' 5 (1.651 m)   Wt 142 lb 8 oz (64.6 kg)   SpO2 98%   BMI 23.71 kg/m    Wt Readings from Last 3 Encounters:  05/04/24 142 lb 8 oz (64.6 kg)  05/04/23 147 lb 3.2 oz (66.8 kg)  12/02/22 147 lb (66.7 kg)    General:  NAD. Neck: No JVD or HJR. Lungs: Clear to auscultation bilaterally without wheezes or crackles. Heart: Regular rate and rhythm without murmurs, rubs, or gallops. Abdomen: Soft, nontender, nondistended. Extremities: No lower extremity edema.  ASSESSMENT AND PLAN: .    Coronary artery disease with stable angina: Jason Suarez has not had any frank chest pain or exertional dyspnea.  His left ear and eye pain is nonspecific and would be an unusual anginal equivalent, particularly since  he had more typical symptoms in the past.  His myocardial perfusion stress test 2 years ago was also reassuring without evidence of ischemia or scar.  His lipids have been well-controlled.  We will continue his current medications, including atorvastatin , clopidogrel , and isosorbide  mononitrate.  Sinus bradycardia and RBBB: Mild sinus bradycardia noted today with stable RBBB that has been present for years.  Jason Suarez notes that his resting heart rates sometimes dipped into the upper 40s albeit without symptoms.  He is not on any rate lowering medications.  Given  that he is asymptomatic and reports an appropriate chronotropic response to exercise, I think it is reasonable to defer testing.  I have advised him to contact us  if he develops any sudden lightheadedness, marked fatigue, or further drops in his blood pressure to suggest worsening conduction disease.  Continue to avoid rate lowering/AV nodal blocking agents.  Essential hypertension: Blood pressure borderline elevated today.  We will continue his current medications.  Continued exercise and sodium restriction encouraged.  Pulsatile tinnitus: Component of Jason Suarez's left eye and ear pain is accompanied by a pulsatile tinnitus sensation.  Given his established ASCVD, I have recommended obtaining a carotid Doppler for further evaluation.  If symptoms persist, consultation with ENT should be considered.  Hyperlipidemia associated with type 2 diabetes mellitus: Recent labs showed excellent lipid control.  Continue current regimen of atorvastatin  and fish oil.  Ongoing management of DM per Dr. Billy, with most recent hemoglobin A1c being just slightly above goal at 7.1%.     Dispo: Return to clinic in 1 year.  Signed, Lonni Hanson, MD

## 2024-05-14 ENCOUNTER — Ambulatory Visit (HOSPITAL_COMMUNITY)
Admission: RE | Admit: 2024-05-14 | Discharge: 2024-05-14 | Disposition: A | Source: Ambulatory Visit | Attending: Internal Medicine | Admitting: Internal Medicine

## 2024-05-14 DIAGNOSIS — H93A9 Pulsatile tinnitus, unspecified ear: Secondary | ICD-10-CM | POA: Diagnosis not present

## 2024-05-14 DIAGNOSIS — H93A2 Pulsatile tinnitus, left ear: Secondary | ICD-10-CM | POA: Diagnosis not present

## 2024-05-17 ENCOUNTER — Ambulatory Visit: Payer: Self-pay | Admitting: Internal Medicine

## 2024-06-05 DIAGNOSIS — K080 Exfoliation of teeth due to systemic causes: Secondary | ICD-10-CM | POA: Diagnosis not present

## 2024-06-08 DIAGNOSIS — H524 Presbyopia: Secondary | ICD-10-CM | POA: Diagnosis not present

## 2024-07-09 ENCOUNTER — Other Ambulatory Visit: Payer: Self-pay

## 2024-07-09 DIAGNOSIS — R1032 Left lower quadrant pain: Secondary | ICD-10-CM

## 2024-07-23 ENCOUNTER — Other Ambulatory Visit
